# Patient Record
Sex: Male | Born: 1960 | Race: Black or African American | Hispanic: No | Marital: Single | Smoking: Never smoker
Health system: Southern US, Community
[De-identification: ages and names within clinical notes are randomized; demographics above are authoritative.]

---

## 2010-11-29 DIAGNOSIS — F419 Anxiety disorder, unspecified: Secondary | ICD-10-CM | POA: Diagnosis present

## 2010-11-29 DIAGNOSIS — E785 Hyperlipidemia, unspecified: Secondary | ICD-10-CM | POA: Diagnosis present

## 2010-11-29 DIAGNOSIS — F329 Major depressive disorder, single episode, unspecified: Secondary | ICD-10-CM | POA: Insufficient documentation

## 2011-11-27 ENCOUNTER — Emergency Department (HOSPITAL_COMMUNITY)
Admission: EM | Admit: 2011-11-27 | Discharge: 2011-11-27 | Disposition: A | Discharge status: Home / Self Care | Payer: PRIVATE HEALTH INSURANCE | Attending: Emergency Medicine | Admitting: Emergency Medicine

## 2011-11-27 ENCOUNTER — Emergency Department (HOSPITAL_COMMUNITY): Payer: PRIVATE HEALTH INSURANCE

## 2011-11-27 ENCOUNTER — Encounter (HOSPITAL_COMMUNITY): Payer: Self-pay | Admitting: *Deleted

## 2011-11-27 DIAGNOSIS — T65891A Toxic effect of other specified substances, accidental (unintentional), initial encounter: Secondary | ICD-10-CM | POA: Insufficient documentation

## 2011-11-27 DIAGNOSIS — F17200 Nicotine dependence, unspecified, uncomplicated: Secondary | ICD-10-CM | POA: Insufficient documentation

## 2011-11-27 DIAGNOSIS — T5994XA Toxic effect of unspecified gases, fumes and vapors, undetermined, initial encounter: Secondary | ICD-10-CM

## 2011-11-27 DIAGNOSIS — J45909 Unspecified asthma, uncomplicated: Secondary | ICD-10-CM | POA: Insufficient documentation

## 2011-11-27 DIAGNOSIS — Y990 Civilian activity done for income or pay: Secondary | ICD-10-CM | POA: Insufficient documentation

## 2011-11-27 DIAGNOSIS — Y9229 Other specified public building as the place of occurrence of the external cause: Secondary | ICD-10-CM | POA: Insufficient documentation

## 2011-11-27 NOTE — ED Notes (Signed)
Pt to radiology.

## 2011-11-27 NOTE — ED Notes (Addendum)
Pt states he is currently "agitated with the current situation which is probably making my blood pressure go up."  Will continue to monitor pt. Denies needs at this time.
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
## 2011-11-30 NOTE — ED Provider Notes (Signed)
Medical screening examination/treatment/procedure(s) were performed by non-physician practitioner and as supervising physician I was immediately available for consultation/collaboration.   Richardean Canal, MD 11/30/11 [phone]

## 2013-01-09 DIAGNOSIS — I10 Essential (primary) hypertension: Secondary | ICD-10-CM | POA: Diagnosis present

## 2013-09-25 ENCOUNTER — Other Ambulatory Visit: Payer: Self-pay | Admitting: *Deleted

## 2014-04-22 ENCOUNTER — Emergency Department (HOSPITAL_COMMUNITY)
Admission: EM | Admit: 2014-04-22 | Discharge: 2014-04-22 | Disposition: A | Discharge status: Home / Self Care | Payer: BC Managed Care – PPO | Attending: Emergency Medicine | Admitting: Emergency Medicine

## 2014-04-22 ENCOUNTER — Encounter (HOSPITAL_COMMUNITY): Payer: Self-pay | Admitting: *Deleted

## 2014-04-22 ENCOUNTER — Emergency Department (HOSPITAL_COMMUNITY): Payer: BC Managed Care – PPO

## 2014-04-22 DIAGNOSIS — Z791 Long term (current) use of non-steroidal anti-inflammatories (NSAID): Secondary | ICD-10-CM | POA: Insufficient documentation

## 2014-04-22 DIAGNOSIS — Z79899 Other long term (current) drug therapy: Secondary | ICD-10-CM | POA: Insufficient documentation

## 2014-04-22 DIAGNOSIS — K5792 Diverticulitis of intestine, part unspecified, without perforation or abscess without bleeding: Secondary | ICD-10-CM

## 2014-04-22 DIAGNOSIS — R000 Tachycardia, unspecified: Secondary | ICD-10-CM | POA: Diagnosis not present

## 2014-04-22 DIAGNOSIS — D72829 Elevated white blood cell count, unspecified: Secondary | ICD-10-CM | POA: Diagnosis not present

## 2014-04-22 DIAGNOSIS — R109 Unspecified abdominal pain: Secondary | ICD-10-CM

## 2014-04-22 DIAGNOSIS — R1030 Lower abdominal pain, unspecified: Secondary | ICD-10-CM

## 2014-04-22 LAB — COMPREHENSIVE METABOLIC PANEL
ALBUMIN: 4.1 g/dL (ref 3.5–5.2)
ALK PHOS: 46 U/L (ref 39–117)
ALT: 15 U/L (ref 0–53)
ANION GAP: 13 (ref 5–15)
AST: 16 U/L (ref 0–37)
BILIRUBIN TOTAL: 1.2 mg/dL (ref 0.3–1.2)
BUN: 8 mg/dL (ref 6–23)
CALCIUM: 9.5 mg/dL (ref 8.4–10.5)
CO2: 22 mmol/L (ref 19–32)
Chloride: 101 mmol/L (ref 96–112)
Creatinine, Ser: 1.02 mg/dL (ref 0.50–1.35)
GFR, EST NON AFRICAN AMERICAN: 81 mL/min — AB (ref >90)
GLUCOSE: 119 mg/dL — AB (ref 70–99)
POTASSIUM: 4.2 mmol/L (ref 3.5–5.1)
SODIUM: 136 mmol/L (ref 135–145)
TOTAL PROTEIN: 7.1 g/dL (ref 6.0–8.3)

## 2014-04-22 LAB — CBC WITH DIFFERENTIAL/PLATELET
BASOS ABS: 0 10*3/uL (ref 0.0–0.1)
Basophils Relative: 0 % (ref 0–1)
EOS ABS: 0 10*3/uL (ref 0.0–0.7)
Eosinophils Relative: 0 % (ref 0–5)
HEMATOCRIT: 42.3 % (ref 39.0–52.0)
Hemoglobin: 14.3 g/dL (ref 13.0–17.0)
LYMPHS ABS: 0.9 10*3/uL (ref 0.7–4.0)
LYMPHS PCT: 3 % — AB (ref 12–46)
MCH: 33.4 pg (ref 26.0–34.0)
MCHC: 33.8 g/dL (ref 30.0–36.0)
MCV: 98.8 fL (ref 78.0–100.0)
Monocytes Absolute: 0.9 10*3/uL (ref 0.1–1.0)
Monocytes Relative: 3 % (ref 3–12)
NEUTROS ABS: 29.6 10*3/uL — AB (ref 1.7–7.7)
NEUTROS PCT: 94 % — AB (ref 43–77)
PLATELETS: 316 10*3/uL (ref 150–400)
RBC: 4.28 MIL/uL (ref 4.22–5.81)
RDW: 14.6 % (ref 11.5–15.5)
WBC: 31.4 10*3/uL — ABNORMAL HIGH (ref 4.0–10.5)

## 2014-04-22 LAB — LIPASE, BLOOD: LIPASE: 18 U/L (ref 11–59)

## 2014-04-22 MED ORDER — IOHEXOL 300 MG/ML  SOLN
25.0000 mL | Freq: Once | INTRAMUSCULAR | Status: AC | PRN
  Administered 2014-04-22: 25 mL via ORAL

## 2014-04-22 MED ORDER — METRONIDAZOLE IN NACL 5-0.79 MG/ML-% IV SOLN
500.0000 mg | Freq: Once | INTRAVENOUS | Status: AC
  Administered 2014-04-22: 500 mg via INTRAVENOUS
  Filled 2014-04-22: qty 100

## 2014-04-22 MED ORDER — IOHEXOL 300 MG/ML  SOLN
80.0000 mL | Freq: Once | INTRAMUSCULAR | Status: AC | PRN
  Administered 2014-04-22: 80 mL via INTRAVENOUS

## 2014-04-22 MED ORDER — SODIUM CHLORIDE 0.9 % IV BOLUS (SEPSIS)
1000.0000 mL | Freq: Once | INTRAVENOUS | Status: AC
Start: 2014-04-22 — End: 2014-04-22
  Administered 2014-04-22: 1000 mL via INTRAVENOUS

## 2014-04-22 MED ORDER — METRONIDAZOLE 500 MG PO TABS: 500.0000 mg | ORAL_TABLET | Freq: Two times a day (BID) | ORAL | Status: DC

## 2014-04-22 MED ORDER — HYDROMORPHONE HCL 1 MG/ML IJ SOLN
1.0000 mg | Freq: Once | INTRAMUSCULAR | Status: AC
  Administered 2014-04-22: 1 mg via INTRAVENOUS
  Filled 2014-04-22: qty 1

## 2014-04-22 MED ORDER — CIPROFLOXACIN HCL 500 MG PO TABS: 500.0000 mg | ORAL_TABLET | Freq: Two times a day (BID) | ORAL | Status: DC

## 2014-04-22 MED ORDER — ONDANSETRON HCL 4 MG/2ML IJ SOLN
4.0000 mg | Freq: Once | INTRAMUSCULAR | Status: AC
  Administered 2014-04-22: 4 mg via INTRAVENOUS
  Filled 2014-04-22: qty 2

## 2014-04-22 MED ORDER — OXYCODONE-ACETAMINOPHEN 5-325 MG PO TABS: 2.0000 | ORAL_TABLET | Freq: Four times a day (QID) | ORAL | Status: DC | PRN

## 2014-04-22 MED ORDER — ONDANSETRON HCL 4 MG PO TABS: 4.0000 mg | ORAL_TABLET | Freq: Four times a day (QID) | ORAL | Status: AC

## 2014-04-22 MED ORDER — CIPROFLOXACIN IN D5W 400 MG/200ML IV SOLN
400.0000 mg | Freq: Once | INTRAVENOUS | Status: AC
  Administered 2014-04-22: 400 mg via INTRAVENOUS
  Filled 2014-04-22: qty 200

## 2014-04-22 NOTE — ED Notes (Signed)
CT aware pt finished contrast 

## 2014-04-22 NOTE — ED Notes (Signed)
Pt states that he has had multiple episodes of lower abdominal cramping after eating greasy foods. Pt also reports an episode for diarrhea.

## 2014-04-22 NOTE — ED Notes (Signed)
Called pharmacy to see if cipro and flagyl are compatible.  Pharmacist confirmed that antibiotics are compatible.

## 2014-04-22 NOTE — ED Provider Notes (Signed)
CSN: [id number]     Arrival date & time 04/22/14  1153 History   First MD Initiated Contact with Patient 04/22/14 1400     Chief Complaint  Patient presents with  . Abdominal Cramping     (Consider location/radiation/quality/duration/timing/severity/associated sxs/prior Treatment) HPI Comments: Patient presents to the emergency department with chief complaint of lower abdominal pain 2 days. Patient states the pain started yesterday after eating some greasy food. He states that the pain significantly worsened overnight until now. He states the pain as an 8 out of 10. He denies any nausea or vomiting, but does report some diarrhea. He states that the pain does not radiate. He denies any melena or hematochezia. He reports a subjective fever and chills. He denies any chest pain, cough, shortness of breath, or other symptoms. He has tried taking a Vicodin with no relief. He denies any prior abdominal surgeries. 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  The history is provided by the patient. No language interpreter was used.    History reviewed. No pertinent past medical history. History reviewed. No pertinent past surgical history. No family history on file. History  Substance Use Topics  . Smoking status: Never Smoker   . Smokeless tobacco: Not on file  . Alcohol Use: No    Review of Systems  Constitutional: Negative for fever and chills.  Respiratory: Negative for shortness of breath.   Cardiovascular: Negative for chest pain.  Gastrointestinal: Positive for abdominal pain and diarrhea. Negative for nausea, vomiting and constipation.  Genitourinary: Negative for dysuria.  All other systems reviewed and are negative.     Allergies  Review of patient's allergies indicates no known allergies.  Home Medications   Prior to Admission medications   Medication Sig Start Date End Date Taking? Authorizing Provider  buPROPion (WELLBUTRIN SR) 200 MG 12  hr tablet Take 200 mg by mouth 2 (two) times daily. 02/17/14  Yes Historical Provider, MD  fluocinonide cream (LIDEX) 0.05 % Apply 1 application topically 2 (two) times daily as needed.   Yes Historical Provider, MD  guaiFENesin (MUCINEX) 600 MG 12 hr tablet Take 1,200 mg by mouth 2 (two) times daily.   Yes Historical Provider, MD  HYDROCODONE-ACETAMINOPHEN PO Take 0.5 tablets by mouth as needed (stomach pain).   Yes Historical Provider, MD  ibuprofen (ADVIL,MOTRIN) 200 MG tablet Take 800 mg by mouth 2 (two) times daily.   Yes Historical Provider, MD  LORazepam (ATIVAN) 1 MG tablet Take 1 mg by mouth every 8 (eight) hours as needed. 04/08/14  Yes Historical Provider, MD   BP 152/95 mmHg  Pulse 130  Temp(Src) 99.2 F (37.3 C)  Resp 16  Ht 5\' 8"  (1.727 m)  Wt 175 lb (79.379 kg)  BMI 26.61 kg/m2  SpO2 98% Physical Exam  Constitutional: He is oriented to person, place, and time. He appears well-developed and well-nourished.  HENT:  Head: Normocephalic and atraumatic.  Eyes: Conjunctivae and EOM are normal. Pupils are equal, round, and reactive to light. Right eye exhibits no discharge. Left eye exhibits no discharge. No scleral icterus.  Neck: Normal range of motion. Neck supple. No JVD present.  Cardiovascular: Regular rhythm and normal heart sounds.  Exam reveals no gallop and no friction rub.   No murmur heard. Tachycardia  Pulmonary/Chest: Effort normal and breath sounds normal. No respiratory distress. He has no wheezes. He has no rales. He exhibits no tenderness.  Abdominal: Soft. He exhibits no distension and no mass. There  is tenderness. There is no rebound and no guarding.  Bilateral lower abdominal tenderness, mild abdominal distention  Musculoskeletal: Normal range of motion. He exhibits no edema or tenderness.  Neurological: He is alert and oriented to person, place, and time.  Skin: Skin is warm and dry.  Psychiatric: He has a normal mood and affect. His behavior is normal.  Judgment and thought content normal.  Nursing note and vitals reviewed.   ED Course  Procedures (including critical care time) Results for orders placed or performed during the hospital encounter of 04/22/14  CBC with Differential  Result Value Ref Range   WBC 31.4 (H) 4.0 - 10.5 K/uL   RBC 4.28 4.22 - 5.81 MIL/uL   Hemoglobin 14.3 13.0 - 17.0 g/dL   HCT 16.1 09.6 - 04.5 %   MCV 98.8 78.0 - 100.0 fL   MCH 33.4 26.0 - 34.0 pg   MCHC 33.8 30.0 - 36.0 g/dL   RDW 40.9 81.1 - 91.4 %   Platelets 316 150 - 400 K/uL   Neutrophils Relative % 94 (H) 43 - 77 %   Lymphocytes Relative 3 (L) 12 - 46 %   Monocytes Relative 3 3 - 12 %   Eosinophils Relative 0 0 - 5 %   Basophils Relative 0 0 - 1 %   Neutro Abs 29.6 (H) 1.7 - 7.7 K/uL   Lymphs Abs 0.9 0.7 - 4.0 K/uL   Monocytes Absolute 0.9 0.1 - 1.0 K/uL   Eosinophils Absolute 0.0 0.0 - 0.7 K/uL   Basophils Absolute 0.0 0.0 - 0.1 K/uL   RBC Morphology STOMATOCYTES   Comprehensive metabolic panel  Result Value Ref Range   Sodium 136 135 - 145 mmol/L   Potassium 4.2 3.5 - 5.1 mmol/L   Chloride 101 96 - 112 mmol/L   CO2 22 19 - 32 mmol/L   Glucose, Bld 119 (H) 70 - 99 mg/dL   BUN 8 6 - 23 mg/dL   Creatinine, Ser 7.82 0.50 - 1.35 mg/dL   Calcium 9.5 8.4 - 95.6 mg/dL   Total Protein 7.1 6.0 - 8.3 g/dL   Albumin 4.1 3.5 - 5.2 g/dL   AST 16 0 - 37 U/L   ALT 15 0 - 53 U/L   Alkaline Phosphatase 46 39 - 117 U/L   Total Bilirubin 1.2 0.3 - 1.2 mg/dL   GFR calc non Af Amer 81 (L) >90 mL/min   GFR calc Af Amer >90 >90 mL/min   Anion gap 13 5 - 15  Lipase, blood  Result Value Ref Range   Lipase 18 11 - 59 U/L   Ct Abdomen Pelvis W Contrast  04/22/2014   CLINICAL DATA:  Severe lower abdominal pain with cramping. One episode of diarrhea.  EXAM: CT ABDOMEN AND PELVIS WITH CONTRAST  TECHNIQUE: Multidetector CT imaging of the abdomen and pelvis was performed using the standard protocol following bolus administration of intravenous contrast.   CONTRAST:  80mL OMNIPAQUE IOHEXOL 300 MG/ML  SOLN  COMPARISON:  None.  FINDINGS: Lower chest: Clear lung bases. Normal heart size without pericardial or pleural effusion.  Hepatobiliary: Normal liver. Normal gallbladder, without biliary ductal dilatation.  Pancreas: Normal, without mass or ductal dilatation.  Spleen: Normal  Adrenals/Urinary Tract: Normal adrenal glands. Normal kidneys, without hydronephrosis. Normal urinary bladder.  Stomach/Bowel: Normal stomach, without wall thickening. Moderate wall thickening of and edema surrounding the sigmoid colon. Extensive colonic diverticulosis surrounds this area. Example image 65. Edema is identified adjacent the urinary bladder on  image 73. No bladder wall thickening or gas within. No drainable pericolonic fluid collection or free perforation.  Normal terminal ileum and appendix.  Normal small bowel.  Vascular/Lymphatic: Aortic and branch vessel atherosclerosis. No abdominopelvic adenopathy.  Reproductive: Normal prostate.  Other: No significant free fluid. A right sided anterior pelvic skin lesion measures 1.4 cm on image 78, just above the base of the penis.  Musculoskeletal: No acute osseous abnormality.  IMPRESSION: 1. Moderate uncomplicated sigmoid diverticulitis. 2. Skin lesion about the anterior right pelvis. Likely a sebaceous cyst. Recommend physical exam correlation.   Electronically Signed   By: Jeronimo Greaves M.D.   On: 04/22/2014 16:12      EKG Interpretation None      MDM   Final diagnoses:  Abdominal pain  Diverticulitis of intestine without perforation or abscess without bleeding    Patient with severe abdominal pain. Pain started yesterday. We'll treat pain, check CT scan. Severe  leukocytosis to 31.4.  CT scan remarkable for sigmoid diverticulitis. This is consistent with the patient's symptoms of left lower quadrant pain and diarrhea. Patient does have a significant leukocytosis, but states that he always has a significant  leukocytosis.  Patient seen by and discussed with Dr. Estell Harpin, who is agreeable with discharge to home with close follow-up.  Will dc with zofron, percocet, flagyl, and cipro.   Patient understands and agrees with the plan. He is stable and ready for discharge.  Filed Vitals:   04/22/14 1515  BP: 155/86  Pulse: 97  Temp:   Resp:       Roxy Horseman, PA-C 04/22/14 1715  Bethann Berkshire, MD 04/23/14 1538

## 2014-04-22 NOTE — Discharge Instructions (Signed)

## 2014-04-22 NOTE — ED Notes (Signed)
Pt made aware to return if symptoms worsen or if any life threatening symptoms occur.   

## 2014-04-22 NOTE — ED Notes (Signed)
Pt taken to CT.

## 2014-06-18 ENCOUNTER — Ambulatory Visit (HOSPITAL_COMMUNITY): Payer: Self-pay | Admitting: Clinical

## 2015-04-12 ENCOUNTER — Ambulatory Visit: Payer: BC Managed Care – PPO | Admitting: Podiatry

## 2015-10-11 DIAGNOSIS — Z636 Dependent relative needing care at home: Secondary | ICD-10-CM | POA: Insufficient documentation

## 2017-06-10 ENCOUNTER — Encounter (HOSPITAL_COMMUNITY): Payer: Self-pay

## 2017-06-10 ENCOUNTER — Other Ambulatory Visit: Payer: Self-pay

## 2017-06-10 ENCOUNTER — Emergency Department (HOSPITAL_COMMUNITY)
Admission: EM | Admit: 2017-06-10 | Discharge: 2017-06-10 | Disposition: A | Discharge status: Home / Self Care | Payer: BC Managed Care – PPO | Attending: Emergency Medicine | Admitting: Emergency Medicine

## 2017-06-10 ENCOUNTER — Emergency Department (HOSPITAL_COMMUNITY): Payer: BC Managed Care – PPO

## 2017-06-10 DIAGNOSIS — K5792 Diverticulitis of intestine, part unspecified, without perforation or abscess without bleeding: Secondary | ICD-10-CM | POA: Diagnosis not present

## 2017-06-10 DIAGNOSIS — R1030 Lower abdominal pain, unspecified: Secondary | ICD-10-CM | POA: Diagnosis present

## 2017-06-10 DIAGNOSIS — Z79899 Other long term (current) drug therapy: Secondary | ICD-10-CM | POA: Diagnosis not present

## 2017-06-10 LAB — CBC WITH DIFFERENTIAL/PLATELET
BASOS ABS: 0 10*3/uL (ref 0.0–0.1)
Basophils Relative: 0 %
EOS PCT: 0 %
Eosinophils Absolute: 0 10*3/uL (ref 0.0–0.7)
HEMATOCRIT: 42.6 % (ref 39.0–52.0)
Hemoglobin: 14.6 g/dL (ref 13.0–17.0)
LYMPHS PCT: 8 %
Lymphs Abs: 1.8 10*3/uL (ref 0.7–4.0)
MCH: 33.5 pg (ref 26.0–34.0)
MCHC: 34.3 g/dL (ref 30.0–36.0)
MCV: 97.7 fL (ref 78.0–100.0)
MONO ABS: 1.6 10*3/uL — AB (ref 0.1–1.0)
MONOS PCT: 7 %
Neutro Abs: 19.2 10*3/uL — ABNORMAL HIGH (ref 1.7–7.7)
Neutrophils Relative %: 85 %
PLATELETS: 328 10*3/uL (ref 150–400)
RBC: 4.36 MIL/uL (ref 4.22–5.81)
RDW: 13.6 % (ref 11.5–15.5)
WBC: 22.6 10*3/uL — ABNORMAL HIGH (ref 4.0–10.5)

## 2017-06-10 LAB — COMPREHENSIVE METABOLIC PANEL
ALT: 20 U/L (ref 17–63)
ANION GAP: 14 (ref 5–15)
AST: 17 U/L (ref 15–41)
Albumin: 4.4 g/dL (ref 3.5–5.0)
Alkaline Phosphatase: 49 U/L (ref 38–126)
BUN: 10 mg/dL (ref 6–20)
CO2: 21 mmol/L — ABNORMAL LOW (ref 22–32)
Calcium: 9.4 mg/dL (ref 8.9–10.3)
Chloride: 102 mmol/L (ref 101–111)
Creatinine, Ser: 0.95 mg/dL (ref 0.61–1.24)
Glucose, Bld: 110 mg/dL — ABNORMAL HIGH (ref 65–99)
POTASSIUM: 3.8 mmol/L (ref 3.5–5.1)
Sodium: 137 mmol/L (ref 135–145)
Total Bilirubin: 1.3 mg/dL — ABNORMAL HIGH (ref 0.3–1.2)
Total Protein: 8.3 g/dL — ABNORMAL HIGH (ref 6.5–8.1)

## 2017-06-10 LAB — URINALYSIS, ROUTINE W REFLEX MICROSCOPIC
BACTERIA UA: NONE SEEN
Bilirubin Urine: NEGATIVE
GLUCOSE, UA: NEGATIVE mg/dL
Ketones, ur: NEGATIVE mg/dL
LEUKOCYTES UA: NEGATIVE
Nitrite: NEGATIVE
PH: 5 (ref 5.0–8.0)
Protein, ur: NEGATIVE mg/dL
SPECIFIC GRAVITY, URINE: 1.032 — AB (ref 1.005–1.030)

## 2017-06-10 LAB — LIPASE, BLOOD: LIPASE: 23 U/L (ref 11–51)

## 2017-06-10 MED ORDER — CIPROFLOXACIN IN D5W 400 MG/200ML IV SOLN
400.0000 mg | Freq: Once | INTRAVENOUS | Status: AC
  Administered 2017-06-10: 400 mg via INTRAVENOUS
  Filled 2017-06-10: qty 200

## 2017-06-10 MED ORDER — METRONIDAZOLE 500 MG PO TABS: 500.0000 mg | ORAL_TABLET | Freq: Two times a day (BID) | ORAL | 0 refills | Status: DC

## 2017-06-10 MED ORDER — ONDANSETRON HCL 4 MG/2ML IJ SOLN
4.0000 mg | Freq: Once | INTRAMUSCULAR | Status: AC
  Administered 2017-06-10: 4 mg via INTRAVENOUS
  Filled 2017-06-10: qty 2

## 2017-06-10 MED ORDER — HYDROCODONE-ACETAMINOPHEN 5-325 MG PO TABS: 1.0000 | ORAL_TABLET | Freq: Four times a day (QID) | ORAL | 0 refills | Status: AC | PRN

## 2017-06-10 MED ORDER — IOPAMIDOL (ISOVUE-300) INJECTION 61%
INTRAVENOUS | Status: AC
  Filled 2017-06-10: qty 100

## 2017-06-10 MED ORDER — SENNOSIDES-DOCUSATE SODIUM 8.6-50 MG PO TABS: 2.0000 | ORAL_TABLET | Freq: Every day | ORAL | 0 refills | Status: AC

## 2017-06-10 MED ORDER — ACETAMINOPHEN 500 MG PO TABS
1000.0000 mg | ORAL_TABLET | Freq: Once | ORAL | Status: AC
  Administered 2017-06-10: 1000 mg via ORAL
  Filled 2017-06-10: qty 2

## 2017-06-10 MED ORDER — HYDROMORPHONE HCL 1 MG/ML IJ SOLN
0.5000 mg | Freq: Once | INTRAMUSCULAR | Status: AC
  Administered 2017-06-10: 0.5 mg via INTRAVENOUS
  Filled 2017-06-10: qty 1

## 2017-06-10 MED ORDER — CIPROFLOXACIN HCL 500 MG PO TABS: 500.0000 mg | ORAL_TABLET | Freq: Two times a day (BID) | ORAL | 0 refills | Status: DC

## 2017-06-10 MED ORDER — IOPAMIDOL (ISOVUE-300) INJECTION 61%
100.0000 mL | Freq: Once | INTRAVENOUS | Status: AC | PRN
  Administered 2017-06-10: 100 mL via INTRAVENOUS

## 2017-06-10 MED ORDER — SODIUM CHLORIDE 0.9 % IV SOLN
INTRAVENOUS | Status: DC
  Administered 2017-06-10: 09:00:00 via INTRAVENOUS

## 2017-06-10 MED ORDER — METRONIDAZOLE IN NACL 5-0.79 MG/ML-% IV SOLN
500.0000 mg | Freq: Once | INTRAVENOUS | Status: AC
  Administered 2017-06-10: 500 mg via INTRAVENOUS
  Filled 2017-06-10: qty 100

## 2017-06-10 NOTE — ED Provider Notes (Signed)
[facility]-EMERGENCY DEPT Provider Note   CSN: [id number] Arrival date & time: 06/10/17  0737     History   Chief Complaint Chief Complaint  Patient presents with  . Abdominal Pain    HPI OSMANI KERSTEN is a 57 y.o. male.  HPI  Patient presents with abdominal pain, nausea, change in bowel movements.  Symptoms began 3 days ago, after the patient recalls eating spaghetti with meatballs. He notes that since that time he has had pain focally in the left lower quadrant, though diffusely as well. Pain is sore, severe, with associated nausea, and as above, change in bowel movements, going from regular stool to inconsistent small production. Generalized weakness, fatigue is present as well, though no clear fever, no confusion, dissertation, chest pain, dyspnea. Patient is here with his wife who assists with the HPI.  History reviewed. No pertinent past medical history.  There are no active problems to display for this patient.   No past surgical history on file.      Home Medications    Prior to Admission medications   Medication Sig Start Date End Date Taking? Authorizing Provider  acetaminophen (TYLENOL) 500 MG tablet Take 1,000 mg by mouth every 6 (six) hours as needed for moderate pain.   Yes [provider]  bismuth subsalicylate (PEPTO BISMOL) 262 MG/15ML suspension Take 30 mLs by mouth every 6 (six) hours as needed for indigestion.   Yes [provider]  buPROPion (WELLBUTRIN SR) 200 MG 12 hr tablet Take 200 mg by mouth 2 (two) times daily. 02/17/14  Yes [provider]  fluocinonide cream (LIDEX) 0.05 % Apply 1 application topically 2 (two) times daily as needed (rash).    Yes [provider]  ibuprofen (ADVIL,MOTRIN) 200 MG tablet Take 800 mg by mouth every 8 (eight) hours as needed for moderate pain.    Yes [provider]  LORazepam (ATIVAN) 1 MG tablet Take 1 mg by mouth every 8 (eight) hours as  needed for anxiety or sleep.  04/08/14  Yes [provider]  simethicone (MYLICON) 125 MG chewable tablet Chew 250 mg by mouth every 6 (six) hours as needed for flatulence.    Yes [provider]  ciprofloxacin (CIPRO) 500 MG tablet Take 1 tablet (500 mg total) by mouth 2 (two) times daily. Patient not taking: Reported on 06/10/2017 04/22/14   Roxy Horseman, PA-C  guaiFENesin (MUCINEX) 600 MG 12 hr tablet Take 1,200 mg by mouth 2 (two) times daily.    [provider]  HYDROCODONE-ACETAMINOPHEN PO Take 0.5 tablets by mouth as needed (stomach pain).    [provider]  metroNIDAZOLE (FLAGYL) 500 MG tablet Take 1 tablet (500 mg total) by mouth 2 (two) times daily. Patient not taking: Reported on 06/10/2017 04/22/14   Roxy Horseman, PA-C  ondansetron (ZOFRAN) 4 MG tablet Take 1 tablet (4 mg total) by mouth every 6 (six) hours. Patient not taking: Reported on 06/10/2017 04/22/14   Roxy Horseman, PA-C  oxyCODONE-acetaminophen (PERCOCET/ROXICET) 5-325 MG per tablet Take 2 tablets by mouth every 6 (six) hours as needed for severe pain. Patient not taking: Reported on 06/10/2017 04/22/14   Roxy Horseman, PA-C    Family History No family history on file.  Social History Social History   Tobacco Use  . Smoking status: Never Smoker  Substance Use Topics  . Alcohol use: No  . Drug use: No     Allergies   Patient has no known allergies.   Review  of Systems Review of Systems  Constitutional:       Per HPI, otherwise negative  HENT:       Per HPI, otherwise negative  Respiratory:       Per HPI, otherwise negative  Cardiovascular:       Per HPI, otherwise negative  Gastrointestinal: Positive for abdominal pain and nausea. Negative for vomiting.  Endocrine:       Negative aside from HPI  Genitourinary:       Neg aside from HPI   Musculoskeletal:       Per HPI, otherwise negative  Skin: Negative.   Neurological: Positive for weakness. Negative for  syncope.     Physical Exam Updated Vital Signs BP (!) 159/101   Pulse 90   Temp 98.6 F (37 C) (Oral)   Resp 18   SpO2 98%   Physical Exam  Constitutional: He is oriented to person, place, and time. He appears well-developed. No distress.  HENT:  Head: Normocephalic and atraumatic.  Eyes: Conjunctivae and EOM are normal.  Cardiovascular: Normal rate and regular rhythm.  Pulmonary/Chest: Effort normal. No stridor. No respiratory distress.  Abdominal: He exhibits no distension. There is tenderness in the left lower quadrant. There is guarding. There is no rigidity.  Musculoskeletal: He exhibits no edema.  Neurological: He is alert and oriented to person, place, and time.  Skin: Skin is warm and dry.  Psychiatric: He has a normal mood and affect.  Nursing note and vitals reviewed.    ED Treatments / Results  Labs (all labs ordered are listed, but only abnormal results are displayed) Labs Reviewed  COMPREHENSIVE METABOLIC PANEL - Abnormal; Notable for the following components:      Result Value   CO2 21 (*)    Glucose, Bld 110 (*)    Total Protein 8.3 (*)    Total Bilirubin 1.3 (*)    All other components within normal limits  CBC WITH DIFFERENTIAL/PLATELET - Abnormal; Notable for the following components:   WBC 22.6 (*)    Neutro Abs 19.2 (*)    Monocytes Absolute 1.6 (*)    All other components within normal limits  URINALYSIS, ROUTINE W REFLEX MICROSCOPIC - Abnormal; Notable for the following components:   Specific Gravity, Urine 1.032 (*)    Hgb urine dipstick SMALL (*)    All other components within normal limits  LIPASE, BLOOD    EKG None  Radiology Ct Abdomen Pelvis W Contrast  Result Date: 06/10/2017 CLINICAL DATA:  Lower abdominal pain after eating spicy food. Possible diverticulitis. EXAM: CT ABDOMEN AND PELVIS WITH CONTRAST TECHNIQUE: Multidetector CT imaging of the abdomen and pelvis was performed using the standard protocol following bolus  administration of intravenous contrast. CONTRAST:  ISOVUE-300 IOPAMIDOL (ISOVUE-300) INJECTION 61% COMPARISON:  04/22/2014 FINDINGS: Lower chest: Linear atelectasis right base. Hepatobiliary: Normal. Pancreas: Normal. Spleen: Normal. Adrenals/Urinary Tract: Adrenal glands are normal. Kidneys normal size without hydronephrosis or nephrolithiasis. Ureters and bladder are normal. Stomach/Bowel: Stomach and small bowel are within normal. Appendix is normal. There is diverticulosis of the colon with adjacent inflammatory change over the distal descending to sigmoid colon in the left lower quadrant. This is compatible with acute diverticulitis. There is no evidence of perforation or diverticular abscess. Small amount of free fluid in the left pericolic gutter. Vascular/Lymphatic: Mild calcified plaque over the abdominal aorta and iliac arteries. No adenopathy. Reproductive: Normal. Other: None. Musculoskeletal: No acute findings. IMPRESSION: Colonic diverticulosis with evidence of acute diverticulitis involving the distal descending  colon and proximal sigmoid colon in the left lower quadrant. No perforation or diverticular abscess. Right basilar atelectasis. Aortic Atherosclerosis (ICD10-I70.0). Electronically Signed   By: Elberta Fortis M.D.   On: 06/10/2017 09:57    Procedures Procedures (including critical care time)  Medications Ordered in ED Medications  0.9 %  sodium chloride infusion ( Intravenous New Bag/Given 06/10/17 0831)  iopamidol (ISOVUE-300) 61 % injection (has no administration in time range)  ondansetron (ZOFRAN) injection 4 mg (4 mg Intravenous Given 06/10/17 0832)  HYDROmorphone (DILAUDID) injection 0.5 mg (0.5 mg Intravenous Given 06/10/17 0831)  acetaminophen (TYLENOL) tablet 1,000 mg (1,000 mg Oral Given 06/10/17 0831)  iopamidol (ISOVUE-300) 61 % injection 100 mL (100 mLs Intravenous Contrast Given 06/10/17 0917)  ciprofloxacin (CIPRO) IVPB 400 mg (0 mg Intravenous Stopped 06/10/17  1237)  metroNIDAZOLE (FLAGYL) IVPB 500 mg (0 mg Intravenous Stopped 06/10/17 1132)     Initial Impression / Assessment and Plan / ED Course  I have reviewed the triage vital signs and the nursing notes.  Pertinent labs & imaging results that were available during my care of the patient were reviewed by me and considered in my medical decision making (see chart for details).  Chart review notable for history of diverticulitis in 2016. Patient states that he had a colonoscopy following that episode, had removal of several polyps, but otherwise no notable findings.    12:46 PM Patient awake and alert, has completed both antibiotics, has had no decline in his condition, has had good pain control here with morphine and fentanyl. We had a lengthy conversation about today's evaluation, including evidence for diverticulitis. No evidence for perforation, abscess. Patient has a gastroenterologist with whom we will follow-up, and having completed IV antibiotics here, x2, and with good pain reduction, and with no evidence for bacteremia, sepsis, patient was discharged in stable condition.  Final Clinical Impressions(s) / ED Diagnoses   Final diagnoses:  Acute diverticulitis     Gerhard Munch, MD 06/10/17 1247

## 2017-06-10 NOTE — ED Notes (Signed)
U/A per MD order. Per pt unable to urinate at this time.

## 2017-06-10 NOTE — ED Triage Notes (Signed)
He c/o lower abd. Pain. He cites eating "spicy food" a couple of hours before this started. He is fairly confident he recognizes this pain as being diverticulitis, which he had had ~ 2 years ago. He denies diarrhea and denies seeing any blood in his stools.

## 2017-10-16 ENCOUNTER — Encounter (HOSPITAL_COMMUNITY): Payer: Self-pay | Admitting: Radiology

## 2017-10-16 ENCOUNTER — Other Ambulatory Visit: Payer: Self-pay

## 2017-10-16 ENCOUNTER — Emergency Department (HOSPITAL_COMMUNITY): Payer: BC Managed Care – PPO

## 2017-10-16 ENCOUNTER — Inpatient Hospital Stay (HOSPITAL_COMMUNITY)
Admission: EM | Admit: 2017-10-16 | Discharge: 2017-10-24 | DRG: 392 | Disposition: A | Discharge status: Home / Self Care | Payer: BC Managed Care – PPO | Attending: Surgery | Admitting: Surgery

## 2017-10-16 DIAGNOSIS — R739 Hyperglycemia, unspecified: Secondary | ICD-10-CM | POA: Diagnosis present

## 2017-10-16 DIAGNOSIS — F419 Anxiety disorder, unspecified: Secondary | ICD-10-CM | POA: Diagnosis present

## 2017-10-16 DIAGNOSIS — K5720 Diverticulitis of large intestine with perforation and abscess without bleeding: Secondary | ICD-10-CM | POA: Diagnosis not present

## 2017-10-16 DIAGNOSIS — Z792 Long term (current) use of antibiotics: Secondary | ICD-10-CM

## 2017-10-16 DIAGNOSIS — I10 Essential (primary) hypertension: Secondary | ICD-10-CM | POA: Diagnosis present

## 2017-10-16 DIAGNOSIS — F439 Reaction to severe stress, unspecified: Secondary | ICD-10-CM | POA: Diagnosis present

## 2017-10-16 DIAGNOSIS — R188 Other ascites: Secondary | ICD-10-CM | POA: Diagnosis present

## 2017-10-16 DIAGNOSIS — E785 Hyperlipidemia, unspecified: Secondary | ICD-10-CM | POA: Diagnosis present

## 2017-10-16 DIAGNOSIS — Z86010 Personal history of colonic polyps: Secondary | ICD-10-CM | POA: Diagnosis not present

## 2017-10-16 DIAGNOSIS — Z79899 Other long term (current) drug therapy: Secondary | ICD-10-CM | POA: Diagnosis not present

## 2017-10-16 DIAGNOSIS — I700 Atherosclerosis of aorta: Secondary | ICD-10-CM | POA: Diagnosis present

## 2017-10-16 DIAGNOSIS — J439 Emphysema, unspecified: Secondary | ICD-10-CM | POA: Diagnosis present

## 2017-10-16 DIAGNOSIS — F17210 Nicotine dependence, cigarettes, uncomplicated: Secondary | ICD-10-CM | POA: Diagnosis present

## 2017-10-16 LAB — COMPREHENSIVE METABOLIC PANEL
ALT: 23 U/L (ref 0–44)
AST: 19 U/L (ref 15–41)
Albumin: 4.3 g/dL (ref 3.5–5.0)
Alkaline Phosphatase: 42 U/L (ref 38–126)
Anion gap: 12 (ref 5–15)
BUN: 14 mg/dL (ref 6–20)
CO2: 22 mmol/L (ref 22–32)
Calcium: 9.1 mg/dL (ref 8.9–10.3)
Chloride: 106 mmol/L (ref 98–111)
Creatinine, Ser: 1.03 mg/dL (ref 0.61–1.24)
Glucose, Bld: 115 mg/dL — ABNORMAL HIGH (ref 70–99)
Potassium: 3.9 mmol/L (ref 3.5–5.1)
SODIUM: 140 mmol/L (ref 135–145)
Total Bilirubin: 0.6 mg/dL (ref 0.3–1.2)
Total Protein: 7.6 g/dL (ref 6.5–8.1)

## 2017-10-16 LAB — CBC WITH DIFFERENTIAL/PLATELET
BASOS ABS: 0 10*3/uL (ref 0.0–0.1)
Basophils Relative: 0 %
Eosinophils Absolute: 0.1 10*3/uL (ref 0.0–0.7)
Eosinophils Relative: 1 %
HCT: 43.2 % (ref 39.0–52.0)
Hemoglobin: 14.6 g/dL (ref 13.0–17.0)
LYMPHS PCT: 16 %
Lymphs Abs: 2.9 10*3/uL (ref 0.7–4.0)
MCH: 33.4 pg (ref 26.0–34.0)
MCHC: 33.8 g/dL (ref 30.0–36.0)
MCV: 98.9 fL (ref 78.0–100.0)
Monocytes Absolute: 0.9 10*3/uL (ref 0.1–1.0)
Monocytes Relative: 5 %
Neutro Abs: 14.2 10*3/uL — ABNORMAL HIGH (ref 1.7–7.7)
Neutrophils Relative %: 78 %
Platelets: 388 10*3/uL (ref 150–400)
RBC: 4.37 MIL/uL (ref 4.22–5.81)
RDW: 13.8 % (ref 11.5–15.5)
WBC: 18.1 10*3/uL — ABNORMAL HIGH (ref 4.0–10.5)

## 2017-10-16 LAB — URINALYSIS, ROUTINE W REFLEX MICROSCOPIC
Bilirubin Urine: NEGATIVE
GLUCOSE, UA: NEGATIVE mg/dL
HGB URINE DIPSTICK: NEGATIVE
KETONES UR: 5 mg/dL — AB
LEUKOCYTES UA: NEGATIVE
Nitrite: NEGATIVE
PROTEIN: NEGATIVE mg/dL
Specific Gravity, Urine: 1.01 (ref 1.005–1.030)
pH: 5 (ref 5.0–8.0)

## 2017-10-16 LAB — LIPASE, BLOOD: LIPASE: 35 U/L (ref 11–51)

## 2017-10-16 MED ORDER — HYDROCODONE-ACETAMINOPHEN 5-325 MG PO TABS
1.0000 | ORAL_TABLET | ORAL | Status: DC | PRN
  Administered 2017-10-17: 2 via ORAL
  Administered 2017-10-17: 1 via ORAL
  Administered 2017-10-18 (×2): 2 via ORAL
  Filled 2017-10-16 (×2): qty 2
  Filled 2017-10-16: qty 1
  Filled 2017-10-16: qty 2

## 2017-10-16 MED ORDER — ONDANSETRON 4 MG PO TBDP: 4.0000 mg | ORAL_TABLET | Freq: Four times a day (QID) | ORAL | Status: DC | PRN

## 2017-10-16 MED ORDER — BUPROPION HCL ER (SR) 100 MG PO TB12
200.0000 mg | ORAL_TABLET | Freq: Two times a day (BID) | ORAL | Status: DC
Start: 2017-10-17 — End: 2017-10-24
  Administered 2017-10-17 – 2017-10-24 (×15): 200 mg via ORAL
  Filled 2017-10-16 (×5): qty 2
  Filled 2017-10-16: qty 1
  Filled 2017-10-16 (×10): qty 2

## 2017-10-16 MED ORDER — MORPHINE SULFATE (PF) 4 MG/ML IV SOLN
4.0000 mg | Freq: Once | INTRAVENOUS | Status: AC
  Administered 2017-10-16: 4 mg via INTRAVENOUS
  Filled 2017-10-16: qty 1

## 2017-10-16 MED ORDER — TRAMADOL HCL 50 MG PO TABS: 50.0000 mg | ORAL_TABLET | Freq: Four times a day (QID) | ORAL | Status: DC | PRN

## 2017-10-16 MED ORDER — LORAZEPAM 2 MG/ML IJ SOLN
1.0000 mg | Freq: Once | INTRAMUSCULAR | Status: AC
  Administered 2017-10-16: 1 mg via INTRAVENOUS
  Filled 2017-10-16: qty 1

## 2017-10-16 MED ORDER — PIPERACILLIN-TAZOBACTAM 3.375 G IVPB
3.3750 g | Freq: Three times a day (TID) | INTRAVENOUS | Status: DC
  Administered 2017-10-17 – 2017-10-24 (×22): 3.375 g via INTRAVENOUS
  Filled 2017-10-16 (×22): qty 50

## 2017-10-16 MED ORDER — PIPERACILLIN-TAZOBACTAM 3.375 G IVPB 30 MIN
3.3750 g | Freq: Once | INTRAVENOUS | Status: AC
  Administered 2017-10-16: 3.375 g via INTRAVENOUS
  Filled 2017-10-16: qty 50

## 2017-10-16 MED ORDER — HYDROMORPHONE HCL 1 MG/ML IJ SOLN
1.0000 mg | Freq: Once | INTRAMUSCULAR | Status: AC
  Administered 2017-10-16: 1 mg via INTRAVENOUS
  Filled 2017-10-16: qty 1

## 2017-10-16 MED ORDER — KCL IN DEXTROSE-NACL 20-5-0.45 MEQ/L-%-% IV SOLN
INTRAVENOUS | Status: DC
  Administered 2017-10-16 – 2017-10-19 (×9): via INTRAVENOUS
  Filled 2017-10-16 (×9): qty 1000

## 2017-10-16 MED ORDER — LORAZEPAM 1 MG PO TABS
1.0000 mg | ORAL_TABLET | Freq: Three times a day (TID) | ORAL | Status: DC | PRN
  Administered 2017-10-17 – 2017-10-24 (×17): 1 mg via ORAL
  Filled 2017-10-16 (×18): qty 1

## 2017-10-16 MED ORDER — IOPAMIDOL (ISOVUE-300) INJECTION 61%
INTRAVENOUS | Status: AC
  Filled 2017-10-16: qty 100

## 2017-10-16 MED ORDER — ONDANSETRON HCL 4 MG/2ML IJ SOLN: 4.0000 mg | Freq: Four times a day (QID) | INTRAMUSCULAR | Status: DC | PRN

## 2017-10-16 MED ORDER — ONDANSETRON HCL 4 MG/2ML IJ SOLN
4.0000 mg | Freq: Once | INTRAMUSCULAR | Status: AC
  Administered 2017-10-16: 4 mg via INTRAVENOUS
  Filled 2017-10-16: qty 2

## 2017-10-16 MED ORDER — MORPHINE SULFATE (PF) 2 MG/ML IV SOLN
1.0000 mg | INTRAVENOUS | Status: DC | PRN
  Administered 2017-10-16 – 2017-10-17 (×2): 2 mg via INTRAVENOUS
  Administered 2017-10-17: 3 mg via INTRAVENOUS
  Administered 2017-10-17: 2 mg via INTRAVENOUS
  Administered 2017-10-17: 3 mg via INTRAVENOUS
  Administered 2017-10-18: 2 mg via INTRAVENOUS
  Administered 2017-10-20: 3 mg via INTRAVENOUS
  Filled 2017-10-16: qty 1
  Filled 2017-10-16: qty 2
  Filled 2017-10-16 (×2): qty 1
  Filled 2017-10-16 (×2): qty 2
  Filled 2017-10-16: qty 1

## 2017-10-16 MED ORDER — MORPHINE SULFATE (PF) 4 MG/ML IV SOLN
4.0000 mg | Freq: Once | INTRAVENOUS | Status: AC | PRN
  Administered 2017-10-16: 4 mg via INTRAVENOUS
  Filled 2017-10-16: qty 1

## 2017-10-16 MED ORDER — ENOXAPARIN SODIUM 40 MG/0.4ML ~~LOC~~ SOLN
40.0000 mg | SUBCUTANEOUS | Status: DC
  Administered 2017-10-17 – 2017-10-24 (×8): 40 mg via SUBCUTANEOUS
  Filled 2017-10-16 (×8): qty 0.4

## 2017-10-16 MED ORDER — IOPAMIDOL (ISOVUE-300) INJECTION 61%
100.0000 mL | Freq: Once | INTRAVENOUS | Status: AC | PRN
  Administered 2017-10-16: 100 mL via INTRAVENOUS

## 2017-10-16 MED ORDER — SODIUM CHLORIDE 0.9 % IV BOLUS
1000.0000 mL | Freq: Once | INTRAVENOUS | Status: AC
Start: 2017-10-16 — End: 2017-10-16
  Administered 2017-10-16: 1000 mL via INTRAVENOUS

## 2017-10-16 NOTE — ED Provider Notes (Signed)
[facility]-EMERGENCY DEPT Provider Note   CSN: [id number] Arrival date & time: 10/16/17  1803     History   Chief Complaint Chief Complaint  Patient presents with  . Abdominal Pain    HPI Caleb Roman is a 57 y.o. male with a history of diverticulitis who presents emergency department today with acute onset of lower abdominal pain.  Patient reports he woke up this morning with 10/10 left lower quadrant abdominal pain that is since become more generalized.  He reports associated nausea without emesis.  He reports he is tried ibuprofen, Tylenol, alcohol and a hydrocodone for his symptoms without any relief.  He reports this feels similar to when he has had diverticulitis in the past but worse.  He notes that he had a large bowel movement yesterday but has not had one today.  He is still passing gas.  He denies any fever, chills, chest pain, shortness of breath, cough, upper abdominal pain, flank pain, urinary symptoms, diarrhea, emesis.  No previous abdominal surgeries.  He reports he is followed by Dr. Ewing Schlein of GI with his last colonoscopy 3 years ago.  He is not on blood thinners.  HPI  No past medical history on file.  There are no active problems to display for this patient.   No past surgical history on file.      Home Medications    Prior to Admission medications   Medication Sig Start Date End Date Taking? Authorizing Provider  acetaminophen (TYLENOL) 500 MG tablet Take 1,000 mg by mouth every 6 (six) hours as needed for moderate pain.    [provider]  bismuth subsalicylate (PEPTO BISMOL) 262 MG/15ML suspension Take 30 mLs by mouth every 6 (six) hours as needed for indigestion.    [provider]  buPROPion (WELLBUTRIN SR) 200 MG 12 hr tablet Take 200 mg by mouth 2 (two) times daily. 02/17/14   [provider]  ciprofloxacin (CIPRO) 500 MG tablet Take 1 tablet (500 mg total) by mouth 2 (two) times daily. 06/10/17    Gerhard Munch, MD  fluocinonide cream (LIDEX) 0.05 % Apply 1 application topically 2 (two) times daily as needed (rash).     [provider]  HYDROcodone-acetaminophen (NORCO/VICODIN) 5-325 MG tablet Take 1 tablet by mouth every 6 (six) hours as needed for severe pain. 06/10/17   Gerhard Munch, MD  ibuprofen (ADVIL,MOTRIN) 200 MG tablet Take 800 mg by mouth every 8 (eight) hours as needed for moderate pain.     [provider]  LORazepam (ATIVAN) 1 MG tablet Take 1 mg by mouth every 8 (eight) hours as needed for anxiety or sleep.  04/08/14   [provider]  metroNIDAZOLE (FLAGYL) 500 MG tablet Take 1 tablet (500 mg total) by mouth 2 (two) times daily. 06/10/17   Gerhard Munch, MD  ondansetron (ZOFRAN) 4 MG tablet Take 1 tablet (4 mg total) by mouth every 6 (six) hours. Patient not taking: Reported on 06/10/2017 04/22/14   Roxy Horseman, PA-C  simethicone (MYLICON) 125 MG chewable tablet Chew 250 mg by mouth every 6 (six) hours as needed for flatulence.     [provider]    Family History No family history on file.  Social History Social History   Tobacco Use  . Smoking status: Never Smoker  Substance Use Topics  . Alcohol use: No  . Drug use: No     Allergies   Patient has no known allergies.   Review of Systems  Review of Systems  All other systems reviewed and are negative.    Physical Exam Updated Vital Signs BP (!) 149/83   Pulse (!) 106   Temp 99.3 F (37.4 C) (Oral)   Resp 19   Wt 79.4 kg   SpO2 94%   BMI 26.62 kg/m   Physical Exam  Constitutional: He appears well-developed and well-nourished.  Appears uncomfortable  HENT:  Head: Normocephalic and atraumatic.  Right Ear: External ear normal.  Left Ear: External ear normal.  Nose: Nose normal.  Mouth/Throat: Uvula is midline, oropharynx is clear and moist and mucous membranes are normal. No tonsillar exudate.  Eyes: Pupils are equal, round, and reactive to  light. Right eye exhibits no discharge. Left eye exhibits no discharge. No scleral icterus.  Neck: Trachea normal. Neck supple. No spinous process tenderness present. No neck rigidity. Normal range of motion present.  Cardiovascular: Normal rate, regular rhythm and intact distal pulses.  No murmur heard. Pulses:      Radial pulses are 2+ on the right side, and 2+ on the left side.       Dorsalis pedis pulses are 2+ on the right side, and 2+ on the left side.       Posterior tibial pulses are 2+ on the right side, and 2+ on the left side.  Pulmonary/Chest: Effort normal and breath sounds normal. He exhibits no tenderness.  Abdominal: Soft. Bowel sounds are normal. He exhibits no distension. There is generalized tenderness and tenderness in the left lower quadrant. There is guarding (voluntary). There is no rigidity and no rebound.  Musculoskeletal: He exhibits no edema.  Lymphadenopathy:    He has no cervical adenopathy.  Neurological: He is alert.  Skin: Skin is warm and dry. No rash noted. He is not diaphoretic.  Psychiatric: He has a normal mood and affect.  Nursing note and vitals reviewed.    ED Treatments / Results  Labs (all labs ordered are listed, but only abnormal results are displayed) Labs Reviewed  COMPREHENSIVE METABOLIC PANEL - Abnormal; Notable for the following components:      Result Value   Glucose, Bld 115 (*)    All other components within normal limits  CBC WITH DIFFERENTIAL/PLATELET - Abnormal; Notable for the following components:   WBC 18.1 (*)    Neutro Abs 14.2 (*)    All other components within normal limits  URINALYSIS, ROUTINE W REFLEX MICROSCOPIC - Abnormal; Notable for the following components:   Color, Urine STRAW (*)    Ketones, ur 5 (*)    All other components within normal limits  LIPASE, BLOOD    EKG None  Radiology Ct Abdomen Pelvis W Contrast  Result Date: 10/16/2017 CLINICAL DATA:  Abdominal pain. EXAM: CT ABDOMEN AND PELVIS WITH  CONTRAST TECHNIQUE: Multidetector CT imaging of the abdomen and pelvis was performed using the standard protocol following bolus administration of intravenous contrast. CONTRAST:  100mL ISOVUE-300 IOPAMIDOL (ISOVUE-300) INJECTION 61% COMPARISON:  06/10/2017 CT abdomen/pelvis. FINDINGS: Lower chest: Centrilobular emphysema and scattered parenchymal bands at the lung bases. Hepatobiliary: Normal liver size. No liver mass. Normal gallbladder with no radiopaque cholelithiasis. No biliary ductal dilatation. Pancreas: Normal, with no mass or duct dilation. Spleen: Normal size. No mass. Adrenals/Urinary Tract: Normal adrenals. Normal kidneys with no hydronephrosis and no renal mass. Normal bladder. Stomach/Bowel: Normal non-distended stomach. Normal caliber small bowel with no small bowel wall thickening. Normal appendix. Moderate left colonic diverticulosis. There is moderate wall thickening in the proximal sigmoid colon with associated  pericolonic fat stranding and ill-defined fluid. No additional sites of large bowel wall thickening. Vascular/Lymphatic: Atherosclerotic nonaneurysmal abdominal aorta. Patent portal, splenic, hepatic and renal veins. No pathologically enlarged lymph nodes in the abdomen or pelvis. Reproductive: Top-normal size prostate with nonspecific internal prostatic calcification. Other: There is scattered free air under the anterior right hemidiaphragm. No focal fluid collection. Trace ascites in the left pericolic gutter. Musculoskeletal: No aggressive appearing focal osseous lesions. Mild thoracolumbar spondylosis. IMPRESSION: 1. Acute perforated sigmoid diverticulitis with free air under the right hemidiaphragm. No abscess. 2. Trace ascites in the left paracolic gutter. 3. Aortic Atherosclerosis (ICD10-I70.0) and Emphysema (ICD10-J43.9). Critical Value/emergent results were called by telephone at the time of interpretation on 10/16/2017 at 7:55 pm to DR. Linwood Dibbles, Who verbally acknowledged these  results. Electronically Signed   By: Delbert Phenix M.D.   On: 10/16/2017 19:57    Procedures Procedures (including critical care time) CRITICAL CARE Performed by: Jacinto Halim   Total critical care time: 45 minutes - perforated diverticulitis  Critical care time was exclusive of separately billable procedures and treating other patients.  Critical care was necessary to treat or prevent imminent or life-threatening deterioration.  Critical care was time spent personally by me on the following activities: development of treatment plan with patient and/or surrogate as well as nursing, discussions with consultants, evaluation of patient's response to treatment, examination of patient, obtaining history from patient or surrogate, ordering and performing treatments and interventions, ordering and review of laboratory studies, ordering and review of radiographic studies, pulse oximetry and re-evaluation of patient's condition.   Medications Ordered in ED Medications  iopamidol (ISOVUE-300) 61 % injection (has no administration in time range)  piperacillin-tazobactam (ZOSYN) IVPB 3.375 g (has no administration in time range)  LORazepam (ATIVAN) injection 1 mg (has no administration in time range)  HYDROmorphone (DILAUDID) injection 1 mg (has no administration in time range)  morphine 4 MG/ML injection 4 mg (4 mg Intravenous Given 10/16/17 1826)  ondansetron (ZOFRAN) injection 4 mg (4 mg Intravenous Given 10/16/17 1841)  sodium chloride 0.9 % bolus 1,000 mL (1,000 mLs Intravenous New Bag/Given 10/16/17 1841)  morphine 4 MG/ML injection 4 mg (4 mg Intravenous Given 10/16/17 1858)  iopamidol (ISOVUE-300) 61 % injection 100 mL (100 mLs Intravenous Contrast Given 10/16/17 1925)    Initial Impression / Assessment and Plan / ED Course  I have reviewed the triage vital signs and the nursing notes.  Pertinent labs & imaging results that were available during my care of the patient were reviewed by me  and considered in my medical decision making (see chart for details).     57 y.o. male with perforated diverticulitis with free air under the right hemidiaphragm diagnosed by CT. Images reviewed by myself.  There is no evidence of abscess.  Patient does have an associated leukocytosis.  Lab work is otherwise reassuring.  Patient abdominal exam is improved after pain medication.  He is very anxious after hearing of results.  He was given Ativan in the department.  Patient has been n.p.o. since 12 PM.  He is without any formal past medical history.  He notes his only daily medications are Lorazepam and Wellbutrin.  He denies any blood thinner use.  Antibiotics ordered for order set.   8:34 PM Spoke with Dr. Ezzard Standing. He will come see the patient.   Patient admitted to [facility] service.   Final Clinical Impressions(s) / ED Diagnoses   Final diagnoses:  Perforation of sigmoid colon due to  diverticulitis    ED Discharge Orders    None       Princella Pellegrini 10/16/17 2308    Linwood Dibbles, MD 10/19/17 1314

## 2017-10-16 NOTE — H&P (Addendum)
Re:   Caleb Roman DOB:   01-01-1961 MRN:   [id number]  Chief Complaint Abdominal pain  ASSESEMENT AND PLAN: 1.  Perforated diverticulitis  3rd bout of diverticulitis, the first to require hospitalization.  Plan:  NPO, IVF, antibiotics and reassess  2.  Situation stress disorder/very anxious  3.  Atherosclerosis 4.  Smokes one or two cigarettes per day  Chief Complaint  Patient presents with  . Abdominal Pain   PHYSICIAN REQUESTING CONSULTATION: Leary RocaMichael Maczis, PA, [facility]  HISTORY OF PRESENT ILLNESS: Caleb LaymanCrawford D Crull is a 57 y.o. (DOB: 01-01-1961)  male whose primary care physician is Vito BackersEdwards, Joel L, MD [facility] [place].  He is accompanied by his girlfriend/fiancee - [facility].   The patient has had at least 2 prior bouts of diverticular disease.  The first in March 2016 and the last one in May 2019.  Neither required hospitalization.       He has seen Dr. Leary RocaMark Magod, who did a colonoscopy in 2016.  He has not seen Dr. Ewing SchleinMagod since that time.  That colonoscopy record is not part of the Epic chart.  He developed some vague abdominal pain last week, but this pain got better.  He went with his girlfriend to the movies on Sunday, ate some popcorn, and started having some worsening abdominal pain.  The abdominal pain became much worse about 4 AM this morning.  He came to the emergency room this afternoon.  He has no history of stomach, liver, or pancreatic disease.  He has had no prior abdominal surgery.  CT scan of the abdomen:  1. Acute perforated sigmoid diverticulitis with free air under the right hemidiaphragm. No abscess.  2. Trace ascites in the left paracolic gutter.  3. Aortic Atherosclerosis   WBC - 18,100 - 10/16/2017    History reviewed. No pertinent past medical history.   No past surgical history on file.    Current Facility-Administered Medications  Medication Dose Route Frequency Provider Last Rate  Last Dose  . iopamidol (ISOVUE-300) 61 % injection           . piperacillin-tazobactam (ZOSYN) IVPB 3.375 g  3.375 g Intravenous Once Linwood DibblesKnapp, Jon, MD 100 mL/hr at 10/16/17 2043 3.375 g at 10/16/17 2043   Current Outpatient Medications  Medication Sig Dispense Refill  . acetaminophen (TYLENOL) 500 MG tablet Take 1,000 mg by mouth daily as needed for moderate pain.     Marland Kitchen. buPROPion (WELLBUTRIN SR) 200 MG 12 hr tablet Take 200 mg by mouth 2 (two) times daily.    Marland Kitchen. Dextromethorphan-guaiFENesin (MUCINEX DM PO) Take 2 tablets by mouth at bedtime as needed (nasal congestion).    Marland Kitchen. doxycycline (VIBRA-TABS) 100 MG tablet Take 100 mg by mouth 2 (two) times daily.  0  . fluocinonide cream (LIDEX) 0.05 % Apply 1 application topically 2 (two) times daily as needed (rash).     Marland Kitchen. HYDROcodone-acetaminophen (NORCO/VICODIN) 5-325 MG tablet Take 1 tablet by mouth every 6 (six) hours as needed for severe pain. 15 tablet 0  . ibuprofen (ADVIL,MOTRIN) 200 MG tablet Take 800 mg by mouth daily as needed for moderate pain.     Marland Kitchen. LORazepam (ATIVAN) 1 MG tablet Take 1 mg by mouth every 8 (eight) hours as needed for anxiety or sleep.     . ciprofloxacin (CIPRO) 500 MG tablet Take 1 tablet (500 mg total) by mouth 2 (two) times daily. (Patient not taking: Reported on 10/16/2017)  14 tablet 0  . metroNIDAZOLE (FLAGYL) 500 MG tablet Take 1 tablet (500 mg total) by mouth 2 (two) times daily. (Patient not taking: Reported on 10/16/2017) 14 tablet 0  . ondansetron (ZOFRAN) 4 MG tablet Take 1 tablet (4 mg total) by mouth every 6 (six) hours. (Patient not taking: Reported on 10/16/2017) 12 tablet 0     No Known Allergies  REVIEW OF SYSTEMS: Skin:  No history of rash.  No history of abnormal moles. Infection:  No history of hepatitis or HIV.  No history of MRSA. Neurologic:  No history of stroke.  No history of seizure.  No history of headaches. Cardiac:  No history of hypertension. No history of heart disease.  No history of prior  cardiac catheterization.   Pulmonary:  Smokes one or two cigarettes per day.  Endocrine:  No diabetes. No thyroid disease. Gastrointestinal:  See HPI Urologic:  No history of kidney stones.  No history of bladder infections. Musculoskeletal:  No history of joint or back disease. Hematologic:  No bleeding disorder.  No history of anemia.  Not anticoagulated. Psycho-social:  The patient is oriented.   He says that he has situation stress disorder.  He is on wellbutrin and lorazepam.  He is very anxious.  SOCIAL and FAMILY HISTORY: Unmarried. He is accompanied by his girlfriend/fiancee - [facility]. He has no children. He is retired from [employer] x 10 years.  He works Office manager for [employer]  PHYSICAL EXAM: BP (!) 188/104 (BP Location: Left Arm)   Pulse (!) 101   Temp 99.3 F (37.4 C) (Oral)   Resp 18   Wt 79.4 kg   SpO2 100%   BMI 26.62 kg/m   General: WN M who is alert.  He is very anxious. Skin:  Inspection and palpation - no mass or rash. Eyes:  Conjunctiva and lids unremarkable.            Pupils are equal Ears, Nose, Mouth, and Throat:  Ears and nose unremarkable            Lips and teeth are unremarable. Neck: Supple. No mass, trachea midline.  No thyroid mass. Lymph Nodes:  No supraclavicular, cervical, or inguinal nodes. Lungs: Normal respiratory effort.  Clear to auscultation and symmetric breath sounds. Heart:  Palpation of the heart is normal.            Auscultation: RRR. No murmur or rub.  Abdomen: Soft. No mass.  No hernia. No abdominal scars.            He has bowel sounds.  He has some mild to moderate lower abdominal tenderness.  He has no peritoneal sxes. Rectal: Not done. Musculoskeletal:  Good muscle strength and ROM  in upper and lower extremities.  Neurologic:  Grossly intact to motor and sensory function. Psychiatric: Normal judgement and insight. Behavior is normal.            Oriented to time, person, place.   DATA REVIEWED, COUNSELING  AND COORDINATION OF CARE: Epic notes reviewed. Counseling and coordination of care exceeded more than 50% of the time spent with patient. Total time spent with patient and charting: 45 minutes.  Ovidio Kin, MD,  [facility] [address] [place]  [address]   [place], [place]    [address] Phone:  [phone] FAX:  [phone]

## 2017-10-16 NOTE — ED Notes (Signed)
Bed: [id number] Expected date:  Expected time:  Means of arrival:  Comments: [place]

## 2017-10-16 NOTE — ED Triage Notes (Signed)
Pt reports a hx of diverticulitis. Pt reports abd pain that feels the same

## 2017-10-16 NOTE — ED Notes (Signed)
ED TO INPATIENT HANDOFF REPORT  Name/Age/Gender Caleb Roman 57 y.o. male  Code Status   Home/SNF/Other Home  Chief Complaint Diverticultis  Level of Care/Admitting Diagnosis ED Disposition    ED Disposition Condition [facility] Area: [place] [[id number]]  Level of Care: Med-Surg [16]  Diagnosis: Diverticulitis of colon with perforation [016010]  Admitting Physician: Borup, Cornell  Attending Physician: CCS, MD [[id number]]  Estimated length of stay: past midnight tomorrow  Certification:: I certify this patient will need inpatient services for at least 2 midnights  Bed request comments: 5 west  PT Class (Do Not Modify): Inpatient [101]  PT Acc Code (Do Not Modify): Private [1]       Medical History History reviewed. No pertinent past medical history.  Allergies No Known Allergies  IV Location/Drains/Wounds Patient Lines/Drains/Airways Status   Active Line/Drains/Airways    Name:   Placement date:   Placement time:   Site:   Days:   Peripheral IV 10/16/17 Right Antecubital   10/16/17    1823    Antecubital   less than 1          Labs/Imaging Results for orders placed or performed during the hospital encounter of 10/16/17 (from the past 48 hour(s))  Comprehensive metabolic panel     Status: Abnormal   Collection Time: 10/16/17  6:26 PM  Result Value Ref Range   Sodium 140 135 - 145 mmol/L   Potassium 3.9 3.5 - 5.1 mmol/L   Chloride 106 98 - 111 mmol/L   CO2 22 22 - 32 mmol/L   Glucose, Bld 115 (H) 70 - 99 mg/dL   BUN 14 6 - 20 mg/dL   Creatinine, Ser 1.03 0.61 - 1.24 mg/dL   Calcium 9.1 8.9 - 10.3 mg/dL   Total Protein 7.6 6.5 - 8.1 g/dL   Albumin 4.3 3.5 - 5.0 g/dL   AST 19 15 - 41 U/L   ALT 23 0 - 44 U/L   Alkaline Phosphatase 42 38 - 126 U/L   Total Bilirubin 0.6 0.3 - 1.2 mg/dL   GFR calc non Af Amer >60 >60 mL/min   GFR calc Af Amer >60 >60 mL/min    Comment: (NOTE) The eGFR has been calculated using the CKD  EPI equation. This calculation has not been validated in all clinical situations. eGFR's persistently <60 mL/min signify possible Chronic Kidney Disease.    Anion gap 12 5 - 15    Comment: Performed at [facility], [place] [address], [place], [place] [address]  Lipase, blood     Status: None   Collection Time: 10/16/17  6:26 PM  Result Value Ref Range   Lipase 35 11 - 51 U/L    Comment: Performed at [facility], [place] [address], [place], [place] [address]  CBC with Differential     Status: Abnormal   Collection Time: 10/16/17  6:26 PM  Result Value Ref Range   WBC 18.1 (H) 4.0 - 10.5 K/uL   RBC 4.37 4.22 - 5.81 MIL/uL   Hemoglobin 14.6 13.0 - 17.0 g/dL   HCT 43.2 39.0 - 52.0 %   MCV 98.9 78.0 - 100.0 fL   MCH 33.4 26.0 - 34.0 pg   MCHC 33.8 30.0 - 36.0 g/dL   RDW 13.8 11.5 - 15.5 %   Platelets 388 150 - 400 K/uL   Neutrophils Relative % 78 %   Neutro Abs 14.2 (H) 1.7 - 7.7 K/uL  Lymphocytes Relative 16 %   Lymphs Abs 2.9 0.7 - 4.0 K/uL   Monocytes Relative 5 %   Monocytes Absolute 0.9 0.1 - 1.0 K/uL   Eosinophils Relative 1 %   Eosinophils Absolute 0.1 0.0 - 0.7 K/uL   Basophils Relative 0 %   Basophils Absolute 0.0 0.0 - 0.1 K/uL    Comment: Performed at [facility], [place] [address], [place], [place] [address]  Urinalysis, Routine w reflex microscopic     Status: Abnormal   Collection Time: 10/16/17  6:26 PM  Result Value Ref Range   Color, Urine STRAW (A) YELLOW   APPearance CLEAR CLEAR   Specific Gravity, Urine 1.010 1.005 - 1.030   pH 5.0 5.0 - 8.0   Glucose, UA NEGATIVE NEGATIVE mg/dL   Hgb urine dipstick NEGATIVE NEGATIVE   Bilirubin Urine NEGATIVE NEGATIVE   Ketones, ur 5 (A) NEGATIVE mg/dL   Protein, ur NEGATIVE NEGATIVE mg/dL   Nitrite NEGATIVE NEGATIVE   Leukocytes, UA NEGATIVE NEGATIVE    Comment: Performed at [facility] [address], [place], [place] [address]   Ct Abdomen  Pelvis W Contrast  Result Date: 10/16/2017 CLINICAL DATA:  Abdominal pain. EXAM: CT ABDOMEN AND PELVIS WITH CONTRAST TECHNIQUE: Multidetector CT imaging of the abdomen and pelvis was performed using the standard protocol following bolus administration of intravenous contrast. CONTRAST:  127m ISOVUE-300 IOPAMIDOL (ISOVUE-300) INJECTION 61% COMPARISON:  06/10/2017 CT abdomen/pelvis. FINDINGS: Lower chest: Centrilobular emphysema and scattered parenchymal bands at the lung bases. Hepatobiliary: Normal liver size. No liver mass. Normal gallbladder with no radiopaque cholelithiasis. No biliary ductal dilatation. Pancreas: Normal, with no mass or duct dilation. Spleen: Normal size. No mass. Adrenals/Urinary Tract: Normal adrenals. Normal kidneys with no hydronephrosis and no renal mass. Normal bladder. Stomach/Bowel: Normal non-distended stomach. Normal caliber small bowel with no small bowel wall thickening. Normal appendix. Moderate left colonic diverticulosis. There is moderate wall thickening in the proximal sigmoid colon with associated pericolonic fat stranding and ill-defined fluid. No additional sites of large bowel wall thickening. Vascular/Lymphatic: Atherosclerotic nonaneurysmal abdominal aorta. Patent portal, splenic, hepatic and renal veins. No pathologically enlarged lymph nodes in the abdomen or pelvis. Reproductive: Top-normal size prostate with nonspecific internal prostatic calcification. Other: There is scattered free air under the anterior right hemidiaphragm. No focal fluid collection. Trace ascites in the left pericolic gutter. Musculoskeletal: No aggressive appearing focal osseous lesions. Mild thoracolumbar spondylosis. IMPRESSION: 1. Acute perforated sigmoid diverticulitis with free air under the right hemidiaphragm. No abscess. 2. Trace ascites in the left paracolic gutter. 3. Aortic Atherosclerosis (ICD10-I70.0) and Emphysema (ICD10-J43.9). Critical Value/emergent results were called by  telephone at the time of interpretation on 10/16/2017 at 7:55 pm to DR. JDorie Rank Who verbally acknowledged these results. Electronically Signed   By: JIlona SorrelM.D.   On: 10/16/2017 19:57    Pending Labs [employer](From admission, onward)    Start     Ordered   Signed and Held  HIV antibody (Routine Testing)  Once,   R     Signed and Held   Signed and Held  Basic metabolic panel  Tomorrow morning,   R     Signed and Held   Signed and Held  CBC  Tomorrow morning,   R     Signed and Held          Vitals/Pain Today's Vitals   10/16/17 2230 10/16/17 2300 10/16/17 2313 10/16/17 2316  BP: (!) 149/83 135/79 135/79  Pulse: (!) 106 (!) 107 (!) 107   Resp: 19  16   Temp:      TempSrc:      SpO2: 94% 96% 97%   Weight:      PainSc:    6     Isolation Precautions No active isolations  Medications Medications  iopamidol (ISOVUE-300) 61 % injection (has no administration in time range)  morphine 4 MG/ML injection 4 mg (4 mg Intravenous Given 10/16/17 1826)  ondansetron (ZOFRAN) injection 4 mg (4 mg Intravenous Given 10/16/17 1841)  sodium chloride 0.9 % bolus 1,000 mL (0 mLs Intravenous Stopped 10/16/17 2315)  morphine 4 MG/ML injection 4 mg (4 mg Intravenous Given 10/16/17 1858)  iopamidol (ISOVUE-300) 61 % injection 100 mL (100 mLs Intravenous Contrast Given 10/16/17 1925)  piperacillin-tazobactam (ZOSYN) IVPB 3.375 g (0 g Intravenous Stopped 10/16/17 2316)  LORazepam (ATIVAN) injection 1 mg (1 mg Intravenous Given 10/16/17 2040)  HYDROmorphone (DILAUDID) injection 1 mg (1 mg Intravenous Given 10/16/17 2041)    Mobility walks

## 2017-10-16 NOTE — ED Provider Notes (Signed)
Medical screening examination/treatment/procedure(s) were conducted as a shared visit with non-physician practitioner(s) and myself.  I personally evaluated the patient during the encounter.  Pt presented with complaints of abdominal pain.  Known history of diverticulitis.   CT scan shows perforated sigmoid diverticulitis without signs of abscess.  Discussed findings with patient and wife. Will start on abx, consult with general surgery.   Linwood DibblesKnapp, Wilbern Pennypacker, MD 10/16/17 2016

## 2017-10-16 NOTE — ED Notes (Signed)
Pt made aware urine needed. Pt provided with urinal 

## 2017-10-17 ENCOUNTER — Encounter (HOSPITAL_COMMUNITY): Payer: Self-pay

## 2017-10-17 ENCOUNTER — Other Ambulatory Visit: Payer: Self-pay

## 2017-10-17 LAB — CBC
HCT: 40.8 % (ref 39.0–52.0)
Hemoglobin: 13.6 g/dL (ref 13.0–17.0)
MCH: 33.2 pg (ref 26.0–34.0)
MCHC: 33.3 g/dL (ref 30.0–36.0)
MCV: 99.5 fL (ref 78.0–100.0)
PLATELETS: 364 10*3/uL (ref 150–400)
RBC: 4.1 MIL/uL — ABNORMAL LOW (ref 4.22–5.81)
RDW: 14.3 % (ref 11.5–15.5)
WBC: 22.3 10*3/uL — ABNORMAL HIGH (ref 4.0–10.5)

## 2017-10-17 LAB — BASIC METABOLIC PANEL
Anion gap: 8 (ref 5–15)
BUN: 11 mg/dL (ref 6–20)
CALCIUM: 8.1 mg/dL — AB (ref 8.9–10.3)
CHLORIDE: 108 mmol/L (ref 98–111)
CO2: 22 mmol/L (ref 22–32)
CREATININE: 1 mg/dL (ref 0.61–1.24)
GFR calc Af Amer: >60 mL/min (ref >60)
GFR calc non Af Amer: >60 mL/min (ref >60)
Glucose, Bld: 109 mg/dL — ABNORMAL HIGH (ref 70–99)
Potassium: 4.5 mmol/L (ref 3.5–5.1)
Sodium: 138 mmol/L (ref 135–145)

## 2017-10-17 LAB — HIV ANTIBODY (ROUTINE TESTING W REFLEX): HIV Screen 4th Generation wRfx: NONREACTIVE

## 2017-10-17 MED ORDER — HYDRALAZINE HCL 20 MG/ML IJ SOLN
10.0000 mg | INTRAMUSCULAR | Status: DC | PRN
  Administered 2017-10-19 – 2017-10-21 (×3): 10 mg via INTRAVENOUS
  Filled 2017-10-17 (×3): qty 1

## 2017-10-17 NOTE — Progress Notes (Signed)
[facility]/Trauma Progress Note      Assessment/Plan  Situation stress disorder - home ativan and wellbutrin HTN - hydralizine PRN  Perforated sigmoid diverticulitis - free air seen on CT - bowel rest, IV abx, IVF - hopefully this will resolve without need for surgery  FEN: NPO, ice chips, sips with meds, IVF VTE: SCD's, lovenox ID: Zosyn 09/24>> Foley: none Follow up: TBD  DISPO: continue IV abx, IVF and bowel rest until pain improves.     LOS: 1 day    Subjective: CC: LLQ abdominal pain  Pt states pain is improved since admission. He was worried that it was blueberries or popcorn that caused this. He has been stressed due to family issues. His first episode was after his father died. He sees Dr. Ewing Schlein, GI. Fiancee at bedside. Pt was tearful when discussing his father's death and his family stressors.   Objective: Vital signs in last 24 hours: Temp:  [99.1 F (37.3 C)-99.8 F (37.7 C)] 99.2 F (37.3 C) (09/25 0810) Pulse Rate:  [91-109] 97 (09/25 0810) Resp:  [16-19] 17 (09/25 0810) BP: (135-188)/(79-112) 158/89 (09/25 0810) SpO2:  [94 %-100 %] 97 % (09/25 0810) Weight:  [79.4 kg] 79.4 kg (09/24 1816) Last BM Date: 10/16/17  Intake/Output from previous day: 09/24 0701 - 09/25 0700 In: 771 [I.V.:761; IV Piggyback:10] Out: 725 [Urine:725] Intake/Output this shift: Total I/O In: -  Out: 375 [Urine:375]  PE: Gen:  Alert, NAD, pleasant, cooperative Card:  RRR Pulm:  Rate and effort normal Abd: Soft, not distended, +BS, no HSM, TTP in LLQ and suprapubic region with guarding, no signs of peritonitis  Skin: no rashes noted, warm and dry   Anti-infectives: Anti-infectives (From admission, onward)   Start     Dose/Rate Route Frequency Ordered Stop   10/17/17 0600  piperacillin-tazobactam (ZOSYN) IVPB 3.375 g     3.375 g 12.5 mL/hr over 240 Minutes Intravenous Every 8 hours 10/16/17 2336     10/16/17 2000  piperacillin-tazobactam (ZOSYN) IVPB  3.375 g     3.375 g 100 mL/hr over 30 Minutes Intravenous  Once 10/16/17 1957 10/16/17 2316      Lab Results:  Recent Labs    10/16/17 1826 10/17/17 0428  WBC 18.1* 22.3*  HGB 14.6 13.6  HCT 43.2 40.8  PLT 388 364   BMET Recent Labs    10/16/17 1826 10/17/17 0428  NA 140 138  K 3.9 4.5  CL 106 108  CO2 22 22  GLUCOSE 115* 109*  BUN 14 11  CREATININE 1.03 1.00  CALCIUM 9.1 8.1*   PT/INR No results for input(s): LABPROT, INR in the last 72 hours. CMP     Component Value Date/Time   NA 138 10/17/2017 0428   K 4.5 10/17/2017 0428   CL 108 10/17/2017 0428   CO2 22 10/17/2017 0428   GLUCOSE 109 (H) 10/17/2017 0428   BUN 11 10/17/2017 0428   CREATININE 1.00 10/17/2017 0428   CALCIUM 8.1 (L) 10/17/2017 0428   PROT 7.6 10/16/2017 1826   ALBUMIN 4.3 10/16/2017 1826   AST 19 10/16/2017 1826   ALT 23 10/16/2017 1826   ALKPHOS 42 10/16/2017 1826   BILITOT 0.6 10/16/2017 1826   GFRNONAA >60 10/17/2017 0428   GFRAA >60 10/17/2017 0428   Lipase     Component Value Date/Time   LIPASE 35 10/16/2017 1826    Studies/Results: Ct Abdomen Pelvis W Contrast  Result Date: 10/16/2017 CLINICAL DATA:  Abdominal pain. EXAM: CT ABDOMEN AND  PELVIS WITH CONTRAST TECHNIQUE: Multidetector CT imaging of the abdomen and pelvis was performed using the standard protocol following bolus administration of intravenous contrast. CONTRAST:  ISOVUE-300 IOPAMIDOL (ISOVUE-300) INJECTION 61% COMPARISON:  06/10/2017 CT abdomen/pelvis. FINDINGS: Lower chest: Centrilobular emphysema and scattered parenchymal bands at the lung bases. Hepatobiliary: Normal liver size. No liver mass. Normal gallbladder with no radiopaque cholelithiasis. No biliary ductal dilatation. Pancreas: Normal, with no mass or duct dilation. Spleen: Normal size. No mass. Adrenals/Urinary Tract: Normal adrenals. Normal kidneys with no hydronephrosis and no renal mass. Normal bladder. Stomach/Bowel: Normal non-distended stomach.  Normal caliber small bowel with no small bowel wall thickening. Normal appendix. Moderate left colonic diverticulosis. There is moderate wall thickening in the proximal sigmoid colon with associated pericolonic fat stranding and ill-defined fluid. No additional sites of large bowel wall thickening. Vascular/Lymphatic: Atherosclerotic nonaneurysmal abdominal aorta. Patent portal, splenic, hepatic and renal veins. No pathologically enlarged lymph nodes in the abdomen or pelvis. Reproductive: Top-normal size prostate with nonspecific internal prostatic calcification. Other: There is scattered free air under the anterior right hemidiaphragm. No focal fluid collection. Trace ascites in the left pericolic gutter. Musculoskeletal: No aggressive appearing focal osseous lesions. Mild thoracolumbar spondylosis. IMPRESSION: 1. Acute perforated sigmoid diverticulitis with free air under the right hemidiaphragm. No abscess. 2. Trace ascites in the left paracolic gutter. 3. Aortic Atherosclerosis (ICD10-I70.0) and Emphysema (ICD10-J43.9). Critical Value/emergent results were called by telephone at the time of interpretation on 10/16/2017 at 7:55 pm to DR. Linwood Dibbles, Who verbally acknowledged these results. Electronically Signed   By: Delbert Phenix M.D.   On: 10/16/2017 19:57      Jerre Simon , [facility] 10/17/2017, 10:32 AM  Pager: [phone] Mon-Wed, Friday 7:00am-4:30pm Thurs 7am-11:30am  Consults: [phone]

## 2017-10-18 LAB — BASIC METABOLIC PANEL
Anion gap: 7 (ref 5–15)
BUN: 7 mg/dL (ref 6–20)
CO2: 25 mmol/L (ref 22–32)
Calcium: 8.5 mg/dL — ABNORMAL LOW (ref 8.9–10.3)
Chloride: 107 mmol/L (ref 98–111)
Creatinine, Ser: 1.02 mg/dL (ref 0.61–1.24)
GFR calc Af Amer: >60 mL/min (ref >60)
GFR calc non Af Amer: >60 mL/min (ref >60)
Glucose, Bld: 129 mg/dL — ABNORMAL HIGH (ref 70–99)
POTASSIUM: 3.9 mmol/L (ref 3.5–5.1)
SODIUM: 139 mmol/L (ref 135–145)

## 2017-10-18 LAB — CBC
HEMATOCRIT: 38.9 % — AB (ref 39.0–52.0)
Hemoglobin: 12.7 g/dL — ABNORMAL LOW (ref 13.0–17.0)
MCH: 32.5 pg (ref 26.0–34.0)
MCHC: 32.6 g/dL (ref 30.0–36.0)
MCV: 99.5 fL (ref 78.0–100.0)
Platelets: 351 10*3/uL (ref 150–400)
RBC: 3.91 MIL/uL — ABNORMAL LOW (ref 4.22–5.81)
RDW: 14.5 % (ref 11.5–15.5)
WBC: 18.3 10*3/uL — ABNORMAL HIGH (ref 4.0–10.5)

## 2017-10-18 MED ORDER — OXYCODONE HCL 5 MG PO TABS
5.0000 mg | ORAL_TABLET | ORAL | Status: DC | PRN
  Administered 2017-10-18 – 2017-10-21 (×8): 10 mg via ORAL
  Administered 2017-10-22: 5 mg via ORAL
  Administered 2017-10-22: 10 mg via ORAL
  Administered 2017-10-23: 5 mg via ORAL
  Filled 2017-10-18 (×2): qty 2
  Filled 2017-10-18: qty 1
  Filled 2017-10-18 (×6): qty 2
  Filled 2017-10-18: qty 1
  Filled 2017-10-18 (×3): qty 2

## 2017-10-18 MED ORDER — ACETAMINOPHEN 500 MG PO TABS
1000.0000 mg | ORAL_TABLET | Freq: Three times a day (TID) | ORAL | Status: DC
  Administered 2017-10-18 – 2017-10-20 (×5): 1000 mg via ORAL
  Filled 2017-10-18 (×6): qty 2

## 2017-10-18 NOTE — Progress Notes (Signed)
    CC: Abdominal pain  Subjective: Patient feels slightly better pains ongoing left lower quadrant.  Objective: Vital signs in last 24 hours: Temp:  [98.7 F (37.1 C)-100.9 F (38.3 C)] 99.5 F (37.5 C) (09/26 0503) Pulse Rate:  [86-93] 86 (09/26 0503) Resp:  [18] 18 (09/26 0503) BP: (152-171)/(78-108) 160/87 (09/26 0503) SpO2:  [94 %-98 %] 96 % (09/26 0503) Weight:  [79.4 kg] 79.4 kg (09/25 1700) Last BM Date: 10/16/17 50 p.o. Recorded 3100 IV 3300 urine No BM recorded Low-grade fever 100.9 at 1900, 100.4 at 0 300, down to 99.5 at 5 AM this morning. Mild blood pressure elevation.  Heart rate and sats are good.  BMP is stable glucose slightly elevated.  WBC 18.3  H/H down slightly CT scan 9/24:Acute perforated sigmoid diverticulitis with free air under the right hemidiaphragm. No abscess.  Trace ascites in the left paracolic gutter. Aortic Atherosclerosis/ Emphysema     Intake/Output from previous day: 09/25 0701 - 09/26 0700 In: 3130.1 [P.O.:50; I.V.:2940; IV Piggyback:140] Out: 3300 [Urine:3300] Intake/Output this shift: Total I/O In: 267.1 [I.V.:248.6; IV Piggyback:18.5] Out: -   General appearance: alert, cooperative and no distress Resp: clear to auscultation bilaterally GI: Soft, tender in the left lower quadrant, ongoing abdominal discomfort.  Lab Results:  Recent Labs    10/17/17 0428 10/18/17 0434  WBC 22.3* 18.3*  HGB 13.6 12.7*  HCT 40.8 38.9*  PLT 364 351    BMET Recent Labs    10/17/17 0428 10/18/17 0434  NA 138 139  K 4.5 3.9  CL 108 107  CO2 22 25  GLUCOSE 109* 129*  BUN 11 7  CREATININE 1.00 1.02  CALCIUM 8.1* 8.5*   PT/INR No results for input(s): LABPROT, INR in the last 72 hours.  Recent Labs  Lab 10/16/17 1826  AST 19  ALT 23  ALKPHOS 42  BILITOT 0.6  PROT 7.6  ALBUMIN 4.3     Lipase     Component Value Date/Time   LIPASE 35 10/16/2017 1826     Medications: . buPROPion  200 mg Oral BID  . enoxaparin  (LOVENOX) injection  40 mg Subcutaneous Q24H    Assessment/Plan Situation stress disorder - home ativan and wellbutrin HTN - hydralizine PRN  Perforated sigmoid diverticulitis - free air seen on CT - bowel rest, IV abx, IVF - hopefully this will resolve without need for surgery  FEN: NPO, ice chips, sips with meds, IVF VTE: SCD's, lovenox ID: Zosyn 09/24>> day 3 Foley: none Follow up: TBD   Plan: Start him on some clears this morning, continue bowel rest, continue antibiotics recheck labs in the a.m.      LOS: 2 days    Shanie Mauzy 10/18/2017 [phone]

## 2017-10-19 ENCOUNTER — Inpatient Hospital Stay (HOSPITAL_COMMUNITY): Payer: BC Managed Care – PPO

## 2017-10-19 LAB — BASIC METABOLIC PANEL
Anion gap: 7 (ref 5–15)
BUN: 5 mg/dL — ABNORMAL LOW (ref 6–20)
CALCIUM: 8.4 mg/dL — AB (ref 8.9–10.3)
CO2: 24 mmol/L (ref 22–32)
Chloride: 107 mmol/L (ref 98–111)
Creatinine, Ser: 0.99 mg/dL (ref 0.61–1.24)
GLUCOSE: 127 mg/dL — AB (ref 70–99)
Potassium: 3.8 mmol/L (ref 3.5–5.1)
SODIUM: 138 mmol/L (ref 135–145)

## 2017-10-19 LAB — CBC
HCT: 36.7 % — ABNORMAL LOW (ref 39.0–52.0)
Hemoglobin: 12.1 g/dL — ABNORMAL LOW (ref 13.0–17.0)
MCH: 33.1 pg (ref 26.0–34.0)
MCHC: 33 g/dL (ref 30.0–36.0)
MCV: 100.3 fL — ABNORMAL HIGH (ref 78.0–100.0)
PLATELETS: 375 10*3/uL (ref 150–400)
RBC: 3.66 MIL/uL — ABNORMAL LOW (ref 4.22–5.81)
RDW: 14.3 % (ref 11.5–15.5)
WBC: 20 10*3/uL — AB (ref 4.0–10.5)

## 2017-10-19 MED ORDER — IOHEXOL 300 MG/ML  SOLN
100.0000 mL | Freq: Once | INTRAMUSCULAR | Status: AC | PRN
  Administered 2017-10-19: 100 mL via INTRAVENOUS

## 2017-10-19 MED ORDER — SODIUM CHLORIDE 0.9 % IJ SOLN
INTRAMUSCULAR | Status: AC
  Filled 2017-10-19: qty 50

## 2017-10-19 NOTE — Progress Notes (Signed)
    CC: Abdominal pain  Subjective: His pain is a little bit better, and much better than admission.  Still hurts to sit up.  He is tolerating clears and says he is passing a little more flatus.  No BM.  Objective: Vital signs in last 24 hours: Temp:  [98.7 F (37.1 C)-99.1 F (37.3 C)] 98.9 F (37.2 C) (09/27 0609) Pulse Rate:  [83-87] 87 (09/27 0609) Resp:  [18] 18 (09/27 0609) BP: (151-177)/(91-100) 177/99 (09/27 0609) SpO2:  [96 %-99 %] 96 % (09/27 0609) Last BM Date: 10/18/17 240 p.o. Recorded 3000 IV 3050 urine No BM recorded T-max 99.5 BP is still up. WBC up to 20,000. Electrolytes are normal. CT 9/24 Intake/Output from previous day: 09/26 0701 - 09/27 0700 In: 3209.9 [P.O.:240; I.V.:2749.9; IV Piggyback:220] Out: 3050 [Urine:3050] Intake/Output this shift: No intake/output data recorded.  General appearance: alert, cooperative and no distress Resp: clear to auscultation bilaterally GI: Soft, still tender in the left lower quadrant.  Passing some flatus.  Still hurts to move around.  Lab Results:  Recent Labs    10/18/17 0434 10/19/17 0348  WBC 18.3* 20.0*  HGB 12.7* 12.1*  HCT 38.9* 36.7*  PLT 351 375    BMET Recent Labs    10/18/17 0434 10/19/17 0348  NA 139 138  K 3.9 3.8  CL 107 107  CO2 25 24  GLUCOSE 129* 127*  BUN 7 5*  CREATININE 1.02 0.99  CALCIUM 8.5* 8.4*   PT/INR No results for input(s): LABPROT, INR in the last 72 hours.  Recent Labs  Lab 10/16/17 1826  AST 19  ALT 23  ALKPHOS 42  BILITOT 0.6  PROT 7.6  ALBUMIN 4.3     Lipase     Component Value Date/Time   LIPASE 35 10/16/2017 1826     Medications: . acetaminophen  1,000 mg Oral Q8H  . buPROPion  200 mg Oral BID  . enoxaparin (LOVENOX) injection  40 mg Subcutaneous Q24H    Assessment/Plan  Situation stress disorder - home ativan and wellbutrin HTN -hydralizine PRN  Perforated sigmoid diverticulitis - free air seen on CT - bowel rest, IV abx, IVF -  hopefully this will resolve without need for surgery  WUJ:WJXBJY, IVF VTE: SCD's, lovenox NW:GNFAO 09/24>> day 4 Foley:none Follow up:TBD  Plan: His WBC is up, but he symptomatically better.  We will repeat CT scan today labs in a.m.     LOS: 3 days    Jayanna Kroeger 10/19/2017 [phone]

## 2017-10-20 DIAGNOSIS — Z860100 Personal history of colon polyps, unspecified: Secondary | ICD-10-CM

## 2017-10-20 LAB — BASIC METABOLIC PANEL
ANION GAP: 7 (ref 5–15)
BUN: <5 mg/dL — ABNORMAL LOW (ref 6–20)
CO2: 25 mmol/L (ref 22–32)
Calcium: 9.1 mg/dL (ref 8.9–10.3)
Chloride: 109 mmol/L (ref 98–111)
Creatinine, Ser: 0.96 mg/dL (ref 0.61–1.24)
GFR calc Af Amer: >60 mL/min (ref >60)
GFR calc non Af Amer: >60 mL/min (ref >60)
Glucose, Bld: 111 mg/dL — ABNORMAL HIGH (ref 70–99)
POTASSIUM: 3.6 mmol/L (ref 3.5–5.1)
Sodium: 141 mmol/L (ref 135–145)

## 2017-10-20 LAB — CBC
HEMATOCRIT: 38.8 % — AB (ref 39.0–52.0)
Hemoglobin: 12.9 g/dL — ABNORMAL LOW (ref 13.0–17.0)
MCH: 32.9 pg (ref 26.0–34.0)
MCHC: 33.2 g/dL (ref 30.0–36.0)
MCV: 99 fL (ref 78.0–100.0)
Platelets: 443 10*3/uL — ABNORMAL HIGH (ref 150–400)
RBC: 3.92 MIL/uL — AB (ref 4.22–5.81)
RDW: 14.2 % (ref 11.5–15.5)
WBC: 16 10*3/uL — ABNORMAL HIGH (ref 4.0–10.5)

## 2017-10-20 MED ORDER — PSYLLIUM 95 % PO PACK
1.0000 | PACK | Freq: Every day | ORAL | Status: DC
  Administered 2017-10-20 – 2017-10-23 (×4): 1 via ORAL
  Filled 2017-10-20 (×5): qty 1

## 2017-10-20 MED ORDER — PROCHLORPERAZINE EDISYLATE 10 MG/2ML IJ SOLN: 5.0000 mg | INTRAMUSCULAR | Status: DC | PRN

## 2017-10-20 MED ORDER — GABAPENTIN 300 MG PO CAPS
300.0000 mg | ORAL_CAPSULE | Freq: Every day | ORAL | Status: AC
  Administered 2017-10-20 – 2017-10-22 (×3): 300 mg via ORAL
  Filled 2017-10-20 (×3): qty 1

## 2017-10-20 MED ORDER — ACETAMINOPHEN 500 MG PO TABS
1000.0000 mg | ORAL_TABLET | Freq: Four times a day (QID) | ORAL | Status: DC
  Administered 2017-10-20 – 2017-10-24 (×8): 1000 mg via ORAL
  Filled 2017-10-20 (×13): qty 2

## 2017-10-20 MED ORDER — MAGIC MOUTHWASH
15.0000 mL | Freq: Four times a day (QID) | ORAL | Status: DC | PRN
  Filled 2017-10-20: qty 15

## 2017-10-20 MED ORDER — GUAIFENESIN-DM 100-10 MG/5ML PO SYRP: 10.0000 mL | ORAL_SOLUTION | ORAL | Status: DC | PRN

## 2017-10-20 MED ORDER — SACCHAROMYCES BOULARDII 250 MG PO CAPS
250.0000 mg | ORAL_CAPSULE | Freq: Two times a day (BID) | ORAL | Status: DC
  Administered 2017-10-20 – 2017-10-24 (×9): 250 mg via ORAL
  Filled 2017-10-20 (×10): qty 1

## 2017-10-20 MED ORDER — BISACODYL 10 MG RE SUPP: 10.0000 mg | Freq: Two times a day (BID) | RECTAL | Status: DC | PRN

## 2017-10-20 MED ORDER — LIP MEDEX EX OINT
1.0000 "application " | TOPICAL_OINTMENT | Freq: Two times a day (BID) | CUTANEOUS | Status: DC
  Administered 2017-10-20 – 2017-10-23 (×7): 1 via TOPICAL
  Filled 2017-10-20 (×3): qty 7

## 2017-10-20 MED ORDER — MENTHOL 3 MG MT LOZG
1.0000 | LOZENGE | OROMUCOSAL | Status: DC | PRN
  Filled 2017-10-20: qty 9

## 2017-10-20 MED ORDER — METHOCARBAMOL 1000 MG/10ML IJ SOLN
1000.0000 mg | Freq: Four times a day (QID) | INTRAVENOUS | Status: DC | PRN
  Filled 2017-10-20: qty 10

## 2017-10-20 MED ORDER — HYDROCORTISONE 2.5 % RE CREA: 1.0000 "application " | TOPICAL_CREAM | Freq: Four times a day (QID) | RECTAL | Status: DC | PRN

## 2017-10-20 MED ORDER — HYDROCORTISONE 1 % EX CREA: 1.0000 "application " | TOPICAL_CREAM | Freq: Three times a day (TID) | CUTANEOUS | Status: DC | PRN

## 2017-10-20 MED ORDER — TRAMADOL HCL 50 MG PO TABS: 50.0000 mg | ORAL_TABLET | Freq: Four times a day (QID) | ORAL | Status: DC | PRN

## 2017-10-20 MED ORDER — LACTATED RINGERS IV BOLUS: 1000.0000 mL | Freq: Three times a day (TID) | INTRAVENOUS | Status: DC | PRN

## 2017-10-20 MED ORDER — PHENOL 1.4 % MT LIQD: 1.0000 | OROMUCOSAL | Status: DC | PRN

## 2017-10-20 MED ORDER — ALUM & MAG HYDROXIDE-SIMETH 200-200-20 MG/5ML PO SUSP: 30.0000 mL | Freq: Four times a day (QID) | ORAL | Status: DC | PRN

## 2017-10-20 NOTE — Progress Notes (Signed)
Caleb Roman [id number] 08/29/1960  CARE TEAM:  PCP: Serita Grammes, MD  Outpatient Care Team: Patient Care Team: Serita Grammes, MD as PCP - General (Family Medicine) Clarene Essex, MD as Consulting Physician (Gastroenterology)  Inpatient Treatment Team: Treatment Team: Attending Provider: Edison Pace, Md, MD; Technician: Wonda Cheng, NT; Consulting Physician: Alphonsa Overall, MD; Consulting Physician: Edison Pace, Md, MD; Technician: Leda Quail, NT; Technician: Reuel Derby, NT; Registered Nurse: Vicente Serene, RN   Problem List:   Principal Problem:   Diverticulitis of colon with perforation Active Problems:   Anxiety   Essential hypertension   Hyperlipidemia      * No surgery found [facility] Stay = 4 days  Assessment  Improving  Plan:  -try full liquid diet -less pain w BMs and falling WBC hopeful sign.  Doubt much to drain w small contained abscess.  If he worsens or leukocytosis worsens, can revisit considering interventional radiology aspiration. -anxiolysis -PRN BP meds. -VTE prophylaxis- SCDs, etc -mobilize as tolerated to help recovery  At some point, he would benefit from elective colonic resection to break the cycles of diverticulitis.  This most recent attack is complicated with contained abscess.  Ideally would wait 6-8 weeks from now, repeat colonoscopy given his history of colon polyps with last colonoscopy in 2016.  Then elective colectomy.  Reasonable for a minimally invasive approach potentially.  Discussed with him.  While he is anxious to consider surgery, he does not want to go through this attack again.  His wife strongly favors elective resection to break the cycle attacks.  He will think about things.  Fortunately, he seems to finally be improving and does not need emergency surgery/Hartmann resection.  We will see.    30 minutes spent in review, evaluation, examination, counseling, and coordination of care.  More than 50% of that  time was spent in counseling.  10/20/2017    Subjective: (Chief complaint)  Pain much less.  Had some bowel movements.  Feeling hungry.  Wife in room.  Walking in hallways  Objective:  Vital signs:  Vitals:   10/19/17 1510 10/19/17 2129 10/19/17 2356 10/20/17 0459  BP: (!) 190/106 (!) 188/100 (!) 168/95 (!) 184/98  Pulse: 79 67 78 77  Resp: _0 Temp: 99 F (37.2 C) 98.5 F (36.9 C)  98.3 F (36.8 C)  TempSrc: Oral Oral  Oral  SpO2: 100% 98%  97%  Weight:      Height:        Last BM Date: 10/18/17  Intake/Output   Yesterday:  09/27 0701 - 09/28 0700 In: 2729.4 [P.O.:660; I.V.:1919.4; IV Piggyback:150] Out: 4967 [Urine:4600] This shift:  Total I/O In: 120 [P.O.:120] Out: 300 [Urine:300]  Bowel function:  Flatus: YES  BM:  YES  Drain: (No drain)   Physical Exam:  General: Pt awake/alert/oriented x4 in no acute distress Eyes: PERRL, normal EOM.  Sclera clear.  No icterus Neuro: CN II-XII intact w/o focal sensory/motor deficits. Lymph: No head/neck/groin lymphadenopathy Psych:  No delerium/psychosis/paranoia HENT: Normocephalic, Mucus membranes moist.  No thrush Neck: Supple, No tracheal deviation Chest: No chest wall pain w good excursion CV:  Pulses intact.  Regular rhythm MS: Normal AROM mjr joints.  No obvious deformity  Abdomen: Soft.  Nondistended.  Tenderness at Left lower quadrant minimal.  No guarding..  No evidence of peritonitis.  No incarcerated hernias.  Ext:  No deformity.  No mjr edema.  No cyanosis Skin: No petechiae /  purpura  Results:   Labs: Results for orders placed or performed during the hospital encounter of 10/16/17 (from the past 48 hour(s))  CBC     Status: Abnormal   Collection Time: 10/19/17  3:48 AM  Result Value Ref Range   WBC 20.0 (H) 4.0 - 10.5 K/uL    Comment: WHITE COUNT CONFIRMED ON SMEAR   RBC 3.66 (L) 4.22 - 5.81 MIL/uL   Hemoglobin 12.1 (L) 13.0 - 17.0 g/dL   HCT 36.7 (L) 39.0 - 52.0 %   MCV  100.3 (H) 78.0 - 100.0 fL   MCH 33.1 26.0 - 34.0 pg   MCHC 33.0 30.0 - 36.0 g/dL   RDW 14.3 11.5 - 15.5 %   Platelets 375 150 - 400 K/uL    Comment: Performed at [facility], [place] [address], [place], [place] [address]  Basic metabolic panel     Status: Abnormal   Collection Time: 10/19/17  3:48 AM  Result Value Ref Range   Sodium 138 135 - 145 mmol/L   Potassium 3.8 3.5 - 5.1 mmol/L   Chloride 107 98 - 111 mmol/L   CO2 24 22 - 32 mmol/L   Glucose, Bld 127 (H) 70 - 99 mg/dL   BUN 5 (L) 6 - 20 mg/dL   Creatinine, Ser 0.99 0.61 - 1.24 mg/dL   Calcium 8.4 (L) 8.9 - 10.3 mg/dL   GFR calc non Af Amer >60 >60 mL/min   GFR calc Af Amer >60 >60 mL/min    Comment: (NOTE) The eGFR has been calculated using the CKD EPI equation. This calculation has not been validated in all clinical situations. eGFR's persistently <60 mL/min signify possible Chronic Kidney Disease.    Anion gap 7 5 - 15    Comment: Performed at [facility], [place] [address] [place], [place] [address]  CBC     Status: Abnormal   Collection Time: 10/20/17  3:28 AM  Result Value Ref Range   WBC 16.0 (H) 4.0 - 10.5 K/uL   RBC 3.92 (L) 4.22 - 5.81 MIL/uL   Hemoglobin 12.9 (L) 13.0 - 17.0 g/dL   HCT 38.8 (L) 39.0 - 52.0 %   MCV 99.0 78.0 - 100.0 fL   MCH 32.9 26.0 - 34.0 pg   MCHC 33.2 30.0 - 36.0 g/dL   RDW 14.2 11.5 - 15.5 %   Platelets 443 (H) 150 - 400 K/uL    Comment: Performed at [facility], [place] [address] [place], [place] [address]  Basic metabolic panel     Status: Abnormal   Collection Time: 10/20/17  3:28 AM  Result Value Ref Range   Sodium 141 135 - 145 mmol/L   Potassium 3.6 3.5 - 5.1 mmol/L   Chloride 109 98 - 111 mmol/L   CO2 25 22 - 32 mmol/L   Glucose, Bld 111 (H) 70 - 99 mg/dL   BUN <5 (L) 6 - 20 mg/dL   Creatinine, Ser 0.96 0.61 - 1.24 mg/dL   Calcium 9.1 8.9 - 10.3 mg/dL   GFR calc non Af Amer >60 >60 mL/min   GFR calc Af Amer >60 >60  mL/min    Comment: (NOTE) The eGFR has been calculated using the CKD EPI equation. This calculation has not been validated in all clinical situations. eGFR's persistently <60 mL/min signify possible Chronic Kidney Disease.    Anion gap 7 5 - 15    Comment: Performed at [facility], [place]  [address], [place], [place] [address]    Imaging / Studies: Ct Abdomen Pelvis W Contrast  Result Date: 10/19/2017 CLINICAL DATA:  Low grade fever. History of perforated diverticulitis. EXAM: CT ABDOMEN AND PELVIS WITH CONTRAST TECHNIQUE: Multidetector CT imaging of the abdomen and pelvis was performed using the standard protocol following bolus administration of intravenous contrast. CONTRAST:  177m OMNIPAQUE IOHEXOL 300 MG/ML  SOLN COMPARISON:  CT, 10/16/2017. FINDINGS: Lower chest: Lung base atelectasis, mildly increased from the prior CT. Trace right pleural effusion. No convincing pneumonia. Heart normal size. Hepatobiliary: No focal liver abnormality is seen. No gallstones, gallbladder wall thickening, or biliary dilatation. Pancreas: Unremarkable. No pancreatic ductal dilatation or surrounding inflammatory changes. Spleen: Relatively small spleen. No splenic mass or focal lesion. Stable from the prior study. Adrenals/Urinary Tract: Adrenal glands are unremarkable. Kidneys are normal, without renal calculi, focal lesion, or hydronephrosis. Bladder is unremarkable. Stomach/Bowel: Changes of sigmoid diverticulitis described previously are similar. There is wall thickening of the proximal sigmoid colon adjacent inflammation. There is a small fluid collection, now better defined, containing bubbles of air, along the anterior inferior margin of the proximal sigmoid colon, measuring 2.9 x 1.5 x 1.5 cm. No free air. There are numerous colonic diverticula. No other areas of diverticulitis. No evidence of bowel obstruction. Small bowel is normal in caliber with no wall thickening or inflammation.  Stomach is unremarkable. Normal appendix is visualized. Vascular/Lymphatic: Aortic atherosclerosis. No enlarged abdominal or pelvic lymph nodes. Reproductive: Unremarkable. Other: Trace pelvic free fluid associated with the sigmoid colon inflammation, similar to the prior exam. Musculoskeletal: No acute or significant osseous findings. IMPRESSION: 1. Persistent sigmoid colon diverticulitis. Small pericolonic abscess has developed along the anterior inferior margin of the proximal sigmoid colon measuring 2.9 cm in greatest dimension. The overall degree of inflammation is stable from the recent prior CT. There are no new areas of colonic inflammation. No free air. 2. Mild increase in lung base atelectasis. Trace right pleural effusion new from the prior study. 3. No other change. Electronically Signed   By: DLajean ManesM.D.   On: 10/19/2017 15:34    Medications / Allergies: per chart  Antibiotics: Anti-infectives (From admission, onward)   Start     Dose/Rate Route Frequency Ordered Stop   10/17/17 0600  piperacillin-tazobactam (ZOSYN) IVPB 3.375 g     3.375 g 12.5 mL/hr over 240 Minutes Intravenous Every 8 hours 10/16/17 2336     10/16/17 2000  piperacillin-tazobactam (ZOSYN) IVPB 3.375 g     3.375 g 100 mL/hr over 30 Minutes Intravenous  Once 10/16/17 1957 10/16/17 2316        Note: Portions of this report may have been transcribed using voice recognition software. Every effort was made to ensure accuracy; however, inadvertent computerized transcription errors may be present.   Any transcriptional errors that result from this process are unintentional.     SAdin Hector MD, FACS, MASCRS Gastrointestinal and Minimally Invasive Surgery    [address] [place] [phone] / Paging [phone]

## 2017-10-21 LAB — CBC
HCT: 37.9 % — ABNORMAL LOW (ref 39.0–52.0)
Hemoglobin: 12.5 g/dL — ABNORMAL LOW (ref 13.0–17.0)
MCH: 32.7 pg (ref 26.0–34.0)
MCHC: 33 g/dL (ref 30.0–36.0)
MCV: 99.2 fL (ref 78.0–100.0)
PLATELETS: 462 10*3/uL — AB (ref 150–400)
RBC: 3.82 MIL/uL — ABNORMAL LOW (ref 4.22–5.81)
RDW: 14.1 % (ref 11.5–15.5)
WBC: 14.9 10*3/uL — AB (ref 4.0–10.5)

## 2017-10-21 LAB — CREATININE, SERUM
Creatinine, Ser: 1.03 mg/dL (ref 0.61–1.24)
GFR calc Af Amer: >60 mL/min (ref >60)

## 2017-10-21 LAB — POTASSIUM: Potassium: 3.4 mmol/L — ABNORMAL LOW (ref 3.5–5.1)

## 2017-10-21 NOTE — Progress Notes (Signed)
    CC: Abdominal pain  Subjective: Continues to improve.  Tolerated full liquids last night.  Had a few episodes of diarrhea, but this is better this AM.    Objective: Vital signs in last 24 hours: Temp:  [98.2 F (36.8 C)-98.6 F (37 C)] 98.2 F (36.8 C) (09/29 0556) Pulse Rate:  [72-97] 79 (09/29 0556) Resp:  [18] 18 (09/29 0556) BP: (139-197)/(86-115) 139/86 (09/29 0556) SpO2:  [98 %-100 %] 99 % (09/29 0556) Last BM Date: 10/20/17 240 p.o. Recorded 3000 IV 3050 urine No BM recorded T-max 99.5 BP is still up. WBC up to 20,000. Electrolytes are normal. CT 9/24 Intake/Output from previous day: 09/28 0701 - 09/29 0700 In: 1029.3 [P.O.:480; I.V.:448.8; IV Piggyback:100.4] Out: 1900 [Urine:1900] Intake/Output this shift: Total I/O In: -  Out: 150 [Urine:150]  General appearance: alert, cooperative and no distress Resp: clear to auscultation bilaterally GI: Soft, minimal distention - significant improvement, minimal tenderness at one spot LLQ.    Lab Results:  Recent Labs    10/20/17 0328 10/21/17 0353  WBC 16.0* 14.9*  HGB 12.9* 12.5*  HCT 38.8* 37.9*  PLT 443* 462*    BMET Recent Labs    10/19/17 0348 10/20/17 0328 10/21/17 0353  NA 138 141  --   K 3.8 3.6 3.4*  CL 107 109  --   CO2 24 25  --   GLUCOSE 127* 111*  --   BUN 5* <5*  --   CREATININE 0.99 0.96 1.03  CALCIUM 8.4* 9.1  --    PT/INR No results for input(s): LABPROT, INR in the last 72 hours.  Recent Labs  Lab 10/16/17 1826  AST 19  ALT 23  ALKPHOS 42  BILITOT 0.6  PROT 7.6  ALBUMIN 4.3     Lipase     Component Value Date/Time   LIPASE 35 10/16/2017 1826     Medications: . acetaminophen  1,000 mg Oral Q6H  . buPROPion  200 mg Oral BID  . enoxaparin (LOVENOX) injection  40 mg Subcutaneous Q24H  . gabapentin  300 mg Oral QHS  . lip balm  1 application Topical BID  . psyllium  1 packet Oral Daily  . saccharomyces boulardii  250 mg Oral BID     Assessment/Plan  Situation stress disorder - home ativan and wellbutrin HTN -hydralizine PRN  Perforated sigmoid diverticulitis -Ct with minimal change, but clinically improving.    ZOX:WRUE diet today. VTE: SCD's, lovenox AV:WUJWJ 09/24>> day 6 Foley:none Follow up:TBD     LOS: 5 days    Almond Lint 10/21/2017

## 2017-10-22 LAB — CBC
HCT: 39.1 % (ref 39.0–52.0)
Hemoglobin: 12.9 g/dL — ABNORMAL LOW (ref 13.0–17.0)
MCH: 32.7 pg (ref 26.0–34.0)
MCHC: 33 g/dL (ref 30.0–36.0)
MCV: 99 fL (ref 78.0–100.0)
Platelets: 511 10*3/uL — ABNORMAL HIGH (ref 150–400)
RBC: 3.95 MIL/uL — ABNORMAL LOW (ref 4.22–5.81)
RDW: 14.3 % (ref 11.5–15.5)
WBC: 17.1 10*3/uL — ABNORMAL HIGH (ref 4.0–10.5)

## 2017-10-22 NOTE — Progress Notes (Addendum)
[facility]/Trauma Progress Note      Assessment/Plan Situation stress disorder - home ativan and wellbutrin HTN -hydralizine PRN  Perforated sigmoid diverticulitis -Ct with minimal change, but clinically improving - no pain at rest, 1/10 pain with palpation    WJX:BJYN diet  VTE: SCD's, lovenox WG:NFAOZ 09/24>> WBC up to 17.1 from 14.9. Afebrile and clinically better Foley:none Follow up:TBD  Plan: trend WBC, am labs, soft diet. If WBC is improved tomorrow then possible discharge tomorrow.    LOS: 6 days    Subjective: CC: minimal abdominal pain  No pain at rest. No pain with BM or flatus. No nausea, vomiting, fever, chills. Overall improved.  Objective: Vital signs in last 24 hours: Temp:  [98.4 F (36.9 C)-98.9 F (37.2 C)] 98.7 F (37.1 C) (09/30 0605) Pulse Rate:  [72-107] 85 (09/30 0605) Resp:  [18-20] 18 (09/30 0605) BP: (154-172)/(83-100) 163/91 (09/30 0605) SpO2:  [95 %-100 %] 95 % (09/30 0605) Last BM Date: 10/21/17  Intake/Output from previous day: 09/29 0701 - 09/30 0700 In: 984.7 [P.O.:840; IV Piggyback:144.7] Out: 1450 [Urine:1450] Intake/Output this shift: No intake/output data recorded.  PE: Gen:  Alert, NAD, pleasant, cooperative Card:  RRR, no M/G/R heard Pulm:  CTA, no W/R/R, effort normal Abd: Soft, ND, +BS, 1/10 TTP of LLQ no guarding Skin: no rashes noted, warm and dry   Anti-infectives: Anti-infectives (From admission, onward)   Start     Dose/Rate Route Frequency Ordered Stop   10/17/17 0600  piperacillin-tazobactam (ZOSYN) IVPB 3.375 g     3.375 g 12.5 mL/hr over 240 Minutes Intravenous Every 8 hours 10/16/17 2336     10/16/17 2000  piperacillin-tazobactam (ZOSYN) IVPB 3.375 g     3.375 g 100 mL/hr over 30 Minutes Intravenous  Once 10/16/17 1957 10/16/17 2316      Lab Results:  Recent Labs    10/21/17 0353 10/22/17 0400  WBC 14.9* 17.1*  HGB 12.5* 12.9*  HCT 37.9* 39.1  PLT 462* 511*    BMET Recent Labs    10/20/17 0328 10/21/17 0353  NA 141  --   K 3.6 3.4*  CL 109  --   CO2 25  --   GLUCOSE 111*  --   BUN <5*  --   CREATININE 0.96 1.03  CALCIUM 9.1  --    PT/INR No results for input(s): LABPROT, INR in the last 72 hours. CMP     Component Value Date/Time   NA 141 10/20/2017 0328   K 3.4 (L) 10/21/2017 0353   CL 109 10/20/2017 0328   CO2 25 10/20/2017 0328   GLUCOSE 111 (H) 10/20/2017 0328   BUN <5 (L) 10/20/2017 0328   CREATININE 1.03 10/21/2017 0353   CALCIUM 9.1 10/20/2017 0328   PROT 7.6 10/16/2017 1826   ALBUMIN 4.3 10/16/2017 1826   AST 19 10/16/2017 1826   ALT 23 10/16/2017 1826   ALKPHOS 42 10/16/2017 1826   BILITOT 0.6 10/16/2017 1826   GFRNONAA >60 10/21/2017 0353   GFRAA >60 10/21/2017 0353   Lipase     Component Value Date/Time   LIPASE 35 10/16/2017 1826    Studies/Results: No results found.    Jerre Simon , [facility] 10/22/2017, 9:07 AM  Pager: [phone] Mon-Wed, Friday 7:00am-4:30pm Thurs 7am-11:30am  Consults: [phone]

## 2017-10-23 LAB — CBC
HCT: 37.3 % — ABNORMAL LOW (ref 39.0–52.0)
Hemoglobin: 12.3 g/dL — ABNORMAL LOW (ref 13.0–17.0)
MCH: 32.5 pg (ref 26.0–34.0)
MCHC: 33 g/dL (ref 30.0–36.0)
MCV: 98.4 fL (ref 78.0–100.0)
PLATELETS: 539 10*3/uL — AB (ref 150–400)
RBC: 3.79 MIL/uL — AB (ref 4.22–5.81)
RDW: 14.3 % (ref 11.5–15.5)
WBC: 16.1 10*3/uL — ABNORMAL HIGH (ref 4.0–10.5)

## 2017-10-23 NOTE — Progress Notes (Signed)
[facility]/Trauma Progress Note      Assessment/Plan Situation stress disorder - home ativan and wellbutrin HTN -hydralizine PRN  Perforated sigmoid diverticulitis -Ct with minimal change, but clinically improving - no pain at rest, 1/10 pain with palpation  ZOX:WRUE diet  VTE: SCD's, lovenox AV:WUJWJ 09/24>> WBC up to 16.1 from 17.1. Afebrile and clinically better, no TTP Foley:none Follow up:TBD  Plan: trend WBC, am labs. If WBC is improved tomorrow then possible discharge tomorrow.    LOS: 7 days    Subjective: CC: diverticulitis  No pain with BM today. Tolerating diet. No fever, chills, nausea or vomiting. No pain.  Objective: Vital signs in last 24 hours: Temp:  [98.6 F (37 C)-99.1 F (37.3 C)] 98.8 F (37.1 C) (10/01 0900) Pulse Rate:  [76-97] 97 (10/01 0900) Resp:  [18-19] 19 (10/01 0900) BP: (145-159)/(86-100) 146/86 (10/01 0900) SpO2:  [97 %-98 %] 97 % (10/01 0900) Last BM Date: 10/22/17  Intake/Output from previous day: 09/30 0701 - 10/01 0700 In: 1680 [P.O.:1680] Out: 3425 [Urine:3425] Intake/Output this shift: No intake/output data recorded.  PE: Gen:  Alert, NAD, pleasant, cooperative Card:  RRR, no M/G/R heard Pulm:  CTA, no W/R/R, effort normal Abd: Soft, ND, +BS, no TTP Skin: no rashes noted, warm and dry   Anti-infectives: Anti-infectives (From admission, onward)   Start     Dose/Rate Route Frequency Ordered Stop   10/17/17 0600  piperacillin-tazobactam (ZOSYN) IVPB 3.375 g     3.375 g 12.5 mL/hr over 240 Minutes Intravenous Every 8 hours 10/16/17 2336     10/16/17 2000  piperacillin-tazobactam (ZOSYN) IVPB 3.375 g     3.375 g 100 mL/hr over 30 Minutes Intravenous  Once 10/16/17 1957 10/16/17 2316      Lab Results:  Recent Labs    10/22/17 0400 10/23/17 0450  WBC 17.1* 16.1*  HGB 12.9* 12.3*  HCT 39.1 37.3*  PLT 511* 539*   BMET Recent Labs    10/21/17 0353  K 3.4*  CREATININE 1.03    PT/INR No results for input(s): LABPROT, INR in the last 72 hours. CMP     Component Value Date/Time   NA 141 10/20/2017 0328   K 3.4 (L) 10/21/2017 0353   CL 109 10/20/2017 0328   CO2 25 10/20/2017 0328   GLUCOSE 111 (H) 10/20/2017 0328   BUN <5 (L) 10/20/2017 0328   CREATININE 1.03 10/21/2017 0353   CALCIUM 9.1 10/20/2017 0328   PROT 7.6 10/16/2017 1826   ALBUMIN 4.3 10/16/2017 1826   AST 19 10/16/2017 1826   ALT 23 10/16/2017 1826   ALKPHOS 42 10/16/2017 1826   BILITOT 0.6 10/16/2017 1826   GFRNONAA >60 10/21/2017 0353   GFRAA >60 10/21/2017 0353   Lipase     Component Value Date/Time   LIPASE 35 10/16/2017 1826    Studies/Results: No results found.    Jerre Simon , [facility] 10/23/2017, 10:00 AM  Pager: [phone] Mon-Wed, Friday 7:00am-4:30pm Thurs 7am-11:30am  Consults: [phone]

## 2017-10-24 LAB — CBC
HEMATOCRIT: 38.6 % — AB (ref 39.0–52.0)
Hemoglobin: 12.7 g/dL — ABNORMAL LOW (ref 13.0–17.0)
MCH: 32.7 pg (ref 26.0–34.0)
MCHC: 32.9 g/dL (ref 30.0–36.0)
MCV: 99.5 fL (ref 78.0–100.0)
Platelets: 626 10*3/uL — ABNORMAL HIGH (ref 150–400)
RBC: 3.88 MIL/uL — ABNORMAL LOW (ref 4.22–5.81)
RDW: 14.2 % (ref 11.5–15.5)
WBC: 13.6 10*3/uL — ABNORMAL HIGH (ref 4.0–10.5)

## 2017-10-24 MED ORDER — AMOXICILLIN-POT CLAVULANATE 875-125 MG PO TABS: 1.0000 | ORAL_TABLET | Freq: Two times a day (BID) | ORAL | 0 refills | Status: AC

## 2017-10-24 MED ORDER — SACCHAROMYCES BOULARDII 250 MG PO CAPS: 250.0000 mg | ORAL_CAPSULE | Freq: Two times a day (BID) | ORAL | Status: AC

## 2017-10-24 NOTE — Discharge Instructions (Signed)
Diverticulitis °Diverticulitis is infection or inflammation of small pouches (diverticula) in the colon that form due to a condition called diverticulosis. Diverticula can trap stool (feces) and bacteria, causing infection and inflammation. °Diverticulitis may cause severe stomach pain and diarrhea. It may lead to tissue damage in the colon that causes bleeding. The diverticula may also burst (rupture) and cause infected stool to enter other areas of the abdomen. °Complications of diverticulitis can include: °· Bleeding. °· Severe infection. °· Severe pain. °· Rupture (perforation) of the colon. °· Blockage (obstruction) of the colon. ° °What are the causes? °This condition is caused by stool becoming trapped in the diverticula, which allows bacteria to grow in the diverticula. This leads to inflammation and infection. °What increases the risk? °You are more likely to develop this condition if: °· You have diverticulosis. The risk for diverticulosis increases if: °? You are overweight or obese. °? You use tobacco products. °? You do not get enough exercise. °· You eat a diet that does not include enough fiber. High-fiber foods include fruits, vegetables, beans, nuts, and whole grains. ° °What are the signs or symptoms? °Symptoms of this condition may include: °· Pain and tenderness in the abdomen. The pain is normally located on the left side of the abdomen, but it may occur in other areas. °· Fever and chills. °· Bloating. °· Cramping. °· Nausea. °· Vomiting. °· Changes in bowel routines. °· Blood in your stool. ° °How is this diagnosed? °This condition is diagnosed based on: °· Your medical history. °· A physical exam. °· Tests to make sure there is nothing else causing your condition. These tests may include: °? Blood tests. °? Urine tests. °? Imaging tests of the abdomen, including X-rays, ultrasounds, MRIs, or CT scans. ° °How is this treated? °Most cases of this condition are mild and can be treated at home.  Treatment may include: °· Taking over-the-counter pain medicines. °· Following a clear liquid diet. °· Taking antibiotic medicines by mouth. °· Rest. ° °More severe cases may need to be treated at a hospital. Treatment may include: °· Not eating or drinking. °· Taking prescription pain medicine. °· Receiving antibiotic medicines through an IV tube. °· Receiving fluids and nutrition through an IV tube. °· Surgery. ° °When your condition is under control, your health care provider may recommend that you have a colonoscopy. This is an exam to look at the entire large intestine. During the exam, a lubricated, bendable tube is inserted into the anus and then passed into the rectum, colon, and other parts of the large intestine. A colonoscopy can show how severe your diverticula are and whether something else may be causing your symptoms. °Follow these instructions at home: °Medicines °· Take over-the-counter and prescription medicines only as told by your health care provider. These include fiber supplements, probiotics, and stool softeners. °· If you were prescribed an antibiotic medicine, take it as told by your health care provider. Do not stop taking the antibiotic even if you start to feel better. °· Do not drive or use heavy machinery while taking prescription pain medicine. °General instructions °· Follow a full liquid diet or another diet as directed by your health care provider. After your symptoms improve, your health care provider may tell you to change your diet. He or she may recommend that you eat a diet that contains at least 25 g (25 grams) of fiber daily. Fiber makes it easier to pass stool. Healthy sources of fiber include: °? Berries. One cup   contains 4-8 grams of fiber. ? Beans or lentils. One half cup contains 5-8 grams of fiber. ? Green vegetables. One cup contains 4 grams of fiber.  Exercise for at least 30 minutes, 3 times each week. You should exercise hard enough to raise your heart rate and  break a sweat.  Keep all follow-up visits as told by your health care provider. This is important. You may need a colonoscopy. Contact a health care provider if:  Your pain does not improve.  You have a hard time drinking or eating food.  Your bowel movements do not return to normal. Get help right away if:  Your pain gets worse.  Your symptoms do not get better with treatment.  Your symptoms suddenly get worse.  You have a fever.  You vomit more than one time.  You have stools that are bloody, black, or tarry. Summary  Diverticulitis is infection or inflammation of small pouches (diverticula) in the colon that form due to a condition called diverticulosis. Diverticula can trap stool (feces) and bacteria, causing infection and inflammation.  You are at higher risk for this condition if you have diverticulosis and you eat a diet that does not include enough fiber.  Most cases of this condition are mild and can be treated at home. More severe cases may need to be treated at a hospital.  When your condition is under control, your health care provider may recommend that you have an exam called a colonoscopy. This exam can show how severe your diverticula are and whether something else may be causing your symptoms. This information is not intended to replace advice given to you by your health care provider. Make sure you discuss any questions you have with your health care provider. Document Released: 10/19/2004 Document Revised: 02/12/2016 Document Reviewed: 02/12/2016 Elsevier Interactive Patient Education  2018 [employer].  Low-Fiber Diet Follow for 4-5 weeks then resume regular diet Fiber is found in fruits, vegetables, and whole grains. A low-fiber diet restricts fibrous foods that are not digested in the small intestine. A diet containing about 10-15 grams of fiber per day is considered low fiber. Low-fiber diets may be used to:  Promote healing and rest the bowel during  intestinal flare-ups.  Prevent blockage of a partially obstructed or narrowed gastrointestinal tract.  Reduce fecal weight and volume.  Slow the movement of feces.  You may be on a low-fiber diet as a transitional diet following surgery, after an injury (trauma), or because of a short (acute) or lifelong (chronic) illness. Your health care provider will determine the length of time you need to stay on this diet. What do I need to know about a low-fiber diet? Always check the fiber content on the packaging's Nutrition Facts label, especially on foods from the grains list. Ask your dietitian if you have questions about specific foods that are related to your condition, especially if the food is not listed below. In general, a low-fiber food will have less than 2 g of fiber. What foods can I eat? Grains All breads and crackers made with white flour. Sweet rolls, doughnuts, waffles, pancakes, [place] toast, bagels. Pretzels, Melba toast, zwieback. Well-cooked cereals, such as cornmeal, farina, or cream cereals. Dry cereals that do not contain whole grains, fruit, or nuts, such as refined corn, wheat, rice, and oat cereals. Potatoes prepared any way without skins, plain pastas and noodles, refined white rice. Use white flour for baking and making sauces. Use allowed list of grains for casseroles, dumplings,  and puddings. Vegetables Strained tomato and vegetable juices. Fresh lettuce, cucumber, spinach. Well-cooked (no skin or pulp) or canned vegetables, such as asparagus, bean sprouts, beets, carrots, green beans, mushrooms, potatoes, pumpkin, spinach, yellow squash, tomato sauce/puree, turnips, yams, and zucchini. Keep servings limited to  cup. Fruits All fruit juices except prune juice. Cooked or canned fruits without skin and seeds, such as applesauce, apricots, cherries, fruit cocktail, grapefruit, grapes, mandarin oranges, melons, peaches, pears, pineapple, and plums. Fresh fruits without skin, such  as apricots, avocados, bananas, melons, pineapple, nectarines, and peaches. Keep servings limited to  cup or 1 piece. Meat and Other Protein Sources Ground or well-cooked tender beef, ham, veal, lamb, pork, or poultry. Eggs, plain cheese. Fish, oysters, shrimp, lobster, and other seafood. Liver, organ meats. Smooth nut butters. Dairy All milk products and alternative dairy substitutes, such as soy, rice, almond, and coconut, not containing added whole nuts, seeds, or added fruit. Beverages Decaf coffee, fruit, and vegetable juices or smoothies (small amounts, with no pulp or skins, and with fruits from allowed list), sports drinks, herbal tea. Condiments Ketchup, mustard, vinegar, cream sauce, cheese sauce, cocoa powder. Spices in moderation, such as allspice, basil, bay leaves, celery powder or leaves, cinnamon, cumin powder, curry powder, ginger, mace, marjoram, onion or garlic powder, oregano, paprika, parsley flakes, ground pepper, rosemary, sage, savory, tarragon, thyme, and turmeric. Sweets and Desserts Plain cakes and cookies, pie made with allowed fruit, pudding, custard, cream pie. Gelatin, fruit, ice, sherbet, frozen ice pops. Ice cream, ice milk without nuts. Plain hard candy, honey, jelly, molasses, syrup, sugar, chocolate syrup, gumdrops, marshmallows. Limit overall sugar intake. Fats and Oil Margarine, butter, cream, mayonnaise, salad oils, plain salad dressings made from allowed foods. Choose healthy fats such as olive oil, canola oil, and omega-3 fatty acids (such as found in salmon or tuna) when possible. Other Bouillon, broth, or cream soups made from allowed foods. Any strained soup. Casseroles or mixed dishes made with allowed foods. The items listed above may not be a complete list of recommended foods or beverages. Contact your dietitian for more options. What foods are not recommended? Grains All whole wheat and whole grain breads and crackers. Multigrains, rye, bran seeds,  nuts, or coconut. Cereals containing whole grains, multigrains, bran, coconut, nuts, raisins. Cooked or dry oatmeal, steel-cut oats. Coarse wheat cereals, granola. Cereals advertised as high fiber. Potato skins. Whole grain pasta, wild or brown rice. Popcorn. Coconut flour. Bran, buckwheat, corn bread, multigrains, rye, wheat germ. Vegetables Fresh, cooked or canned vegetables, such as artichokes, asparagus, beet greens, broccoli, Brussels sprouts, cabbage, celery, cauliflower, corn, eggplant, kale, legumes or beans, okra, peas, and tomatoes. Avoid large servings of any vegetables, especially raw vegetables. Fruits Fresh fruits, such as apples with or without skin, berries, cherries, figs, grapes, grapefruit, guavas, kiwis, mangoes, oranges, papayas, pears, persimmons, pineapple, and pomegranate. Prune juice and juices with pulp, stewed or dried prunes. Dried fruits, dates, raisins. Fruit seeds or skins. Avoid large servings of all fresh fruits. Meats and Other Protein Sources Tough, fibrous meats with gristle. Chunky nut butter. Cheese made with seeds, nuts, or other foods not recommended. Nuts, seeds, legumes (beans, including baked beans), dried peas, beans, lentils. Dairy Yogurt or cheese that contains nuts, seeds, or added fruit. Beverages Fruit juices with high pulp, prune juice. Caffeinated coffee and teas. Condiments Coconut, maple syrup, pickles, olives. Sweets and Desserts Desserts, cookies, or candies that contain nuts or coconut, chunky peanut butter, dried fruits. Jams, preserves with seeds, marmalade. Large amounts of  sugar and sweets. Any other dessert made with fruits from the not recommended list. Other Soups made from vegetables that are not recommended or that contain other foods not recommended. The items listed above may not be a complete list of foods and beverages to avoid. Contact your dietitian for more information. This information is not intended to replace advice given  to you by your health care provider. Make sure you discuss any questions you have with your health care provider. Document Released: 07/01/2001 Document Revised: 06/17/2015 Document Reviewed: 12/02/2012 Elsevier Interactive Patient Education  2017 [employer].

## 2017-10-24 NOTE — Progress Notes (Signed)
Discharge instructions given to pt and all questions were answered. Pt walked out to the lobby and was picked up by her girl friend.

## 2017-10-24 NOTE — Discharge Summary (Signed)
[facility] Surgery/Trauma Discharge Summary   Patient ID: Caleb Roman MRN: [id number] DOB/AGE: 1960/10/02 57 y.o.  Admit date: 10/16/2017 Discharge date: 10/24/2017  Admitting Diagnosis: Perforated sigmoid diverticulitis  Discharge Diagnosis Patient Active Problem List   Diagnosis Date Noted  . History of colonic polyps 10/20/2017  . Diverticulitis of colon with perforation 10/16/2017  . Caregiver stress 10/11/2015  . Essential hypertension 01/09/2013  . Anxiety 11/29/2010  . Depression 11/29/2010  . Hyperlipidemia 11/29/2010    Consultants none  Imaging: No results found.  Procedures None   HPI: Caleb Roman is a 57 y.o. (DOB: [birth year])  male whose primary care physician is Vito Backers, MD [facility], [place]) and comes to the [facility] today with abdominal pain. He is accompanied by his girlfriend/fiancee - [facility]  The patient has had at least 2 prior bouts of diverticular disease.  The first in March 2016 and the last one in May 2019.  Neither required hospitalization.  He has seen Dr. Leary Roca, who did a colonoscopy in 2016.  He has not seen Dr. Ewing Schlein since that time.  That colonoscopy record is not part of the Epic chart. He developed some vague abdominal pain last week, but this pain got better.  He went with his girlfriend to the movies on Sunday, ate some popcorn, and started having some worsening abdominal pain.  The abdominal pain became much worse about 4 AM this morning.  He came to the emergency room this afternoon.  He has no history of stomach, liver, or pancreatic disease.  He has had no prior abdominal surgery.  CT scan of the abdomen:  1. Acute perforated sigmoid diverticulitis with free air under the right hemidiaphragm. No abscess.             2. Trace ascites in the left paracolic gutter.             3. Aortic Atherosclerosis  WBC - 18,100 - 10/16/2017  Hospital Course:  Patient was admitted and placed on bowel rest and IV  antibiotics. As pain improved diet  was advanced as tolerated. Repeat CT showed persistent diverticulitis with small pericolonic abscess, overall stable from last CT. On 10/24/2017, the patient was voiding well, tolerating diet, ambulating well,no abdominal pain, vital signs stable, and felt stable for discharge home.  Patient will follow up as outlined below and knows to call with questions or concerns. Patient was discharged in good condition.  Physical Exam: Gen: Alert, NAD, pleasant, cooperative Card: RRR, no M/G/R heard Pulm: CTA, no W/R/R, effort normal Abd: Soft, ND, +BS,no TTP Skin: no rashes noted, warm and dry  Allergies as of 10/24/2017   No Known Allergies     Medication List    STOP taking these medications   ciprofloxacin 500 MG tablet Commonly known as:  CIPRO   doxycycline 100 MG tablet Commonly known as:  VIBRA-TABS   metroNIDAZOLE 500 MG tablet Commonly known as:  FLAGYL     TAKE these medications   acetaminophen 500 MG tablet Commonly known as:  TYLENOL Take 1,000 mg by mouth daily as needed for moderate pain.   amoxicillin-clavulanate 875-125 MG tablet Commonly known as:  AUGMENTIN Take 1 tablet by mouth every 12 (twelve) hours for 7 days.   buPROPion 200 MG 12 hr tablet Commonly known as:  WELLBUTRIN SR Take 200 mg by mouth 2 (two) times daily.   fluocinonide cream 0.05 % Commonly known as:  LIDEX Apply 1 application topically 2 (two) times  daily as needed (rash).   HYDROcodone-acetaminophen 5-325 MG tablet Commonly known as:  NORCO/VICODIN Take 1 tablet by mouth every 6 (six) hours as needed for severe pain.   ibuprofen 200 MG tablet Commonly known as:  ADVIL,MOTRIN Take 800 mg by mouth daily as needed for moderate pain.   LORazepam 1 MG tablet Commonly known as:  ATIVAN Take 1 mg by mouth every 8 (eight) hours as needed for anxiety or sleep.   MUCINEX DM PO Take 2 tablets by mouth at bedtime as needed (nasal congestion).    ondansetron 4 MG tablet Commonly known as:  ZOFRAN Take 1 tablet (4 mg total) by mouth every 6 (six) hours.   saccharomyces boulardii 250 MG capsule Commonly known as:  FLORASTOR Take 1 capsule (250 mg total) by mouth 2 (two) times daily.        Follow-up Information    Karie Soda, MD. Schedule an appointment as soon as possible for a visit in 3 week(s).   Specialty:  General Surgery Why:  to discuss elective surgery to remove affected part of colon. You will need a colonoscopy in 6-8 weeks with your Gastroenterologist Contact information: [address] [place] [address] [phone]           Signed: Joyce Copa [facility] Surgery 10/24/2017, 10:22 AM Pager: [phone] Consults: [phone] Mon-Fri 7:00 am-4:30 pm Sat-Sun 7:00 am-11:30 am

## 2019-01-02 IMAGING — CT CT ABD-PELV W/ CM
2 of 5 series · 16 of 46 positions shown, 18 images · IV contrast (OMNIPAQUE)
Comparison: CT, 10/16/2017.

CLINICAL DATA: Low grade fever. History of perforated
diverticulitis.

EXAM:
CT ABDOMEN AND PELVIS WITH CONTRAST
TECHNIQUE: Multidetector CT imaging of the abdomen and pelvis was performed
using the standard protocol following bolus administration of
intravenous contrast.
CONTRAST:  100mL OMNIPAQUE IOHEXOL 300 MG/ML  SOLN

[Series 2: axial st · axial · 0.82mm/px · z∈[-681,-301]mm · 13 of 88 slices shown, 15 images]
[im 6/88  soft-tissue]
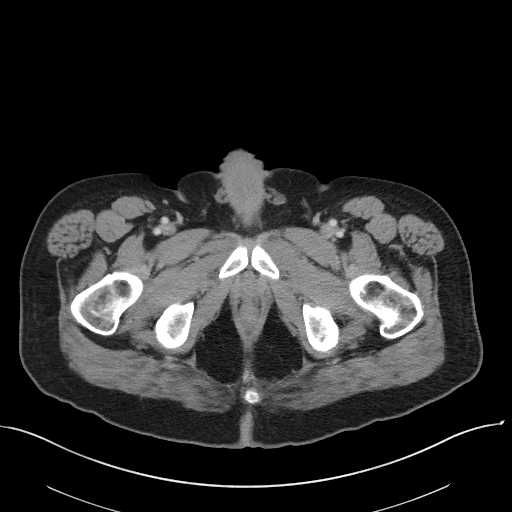
[im 6/88  bone]
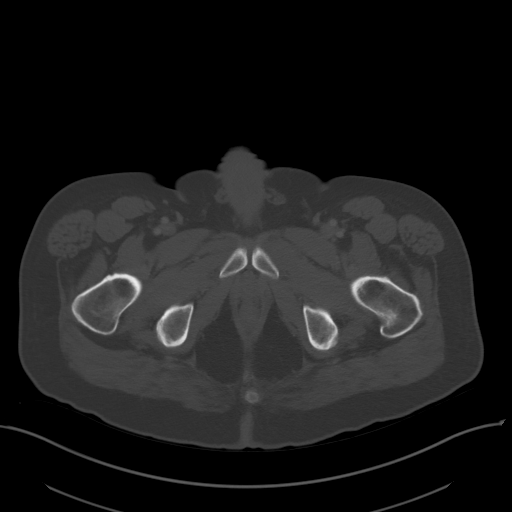
[im 12/88  soft-tissue]
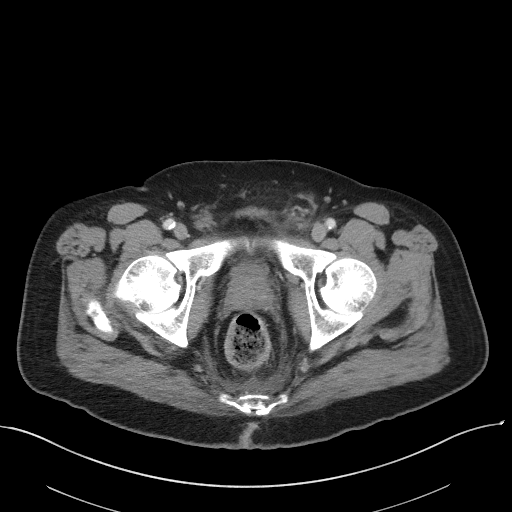
[im 18/88  soft-tissue]
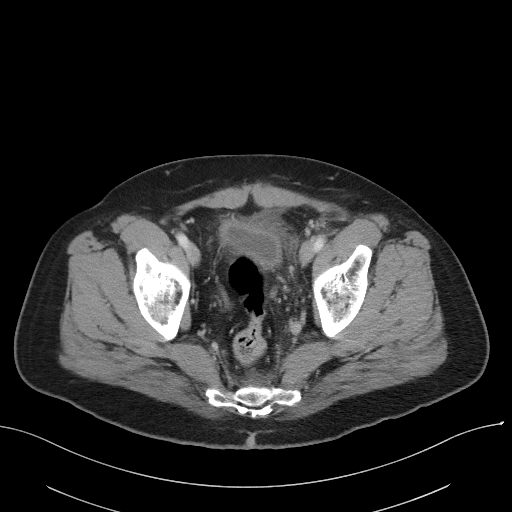
[im 24/88  soft-tissue]
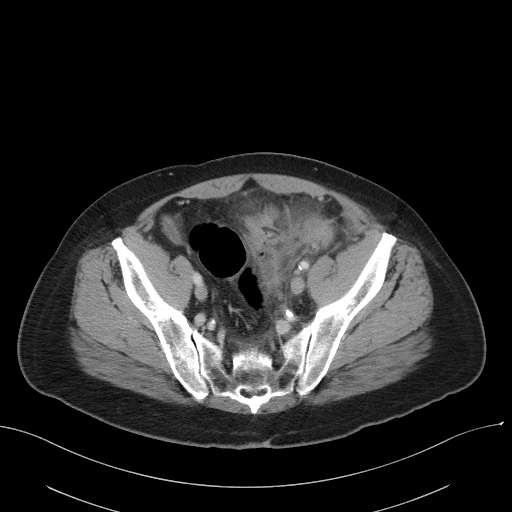
[im 30/88  soft-tissue]
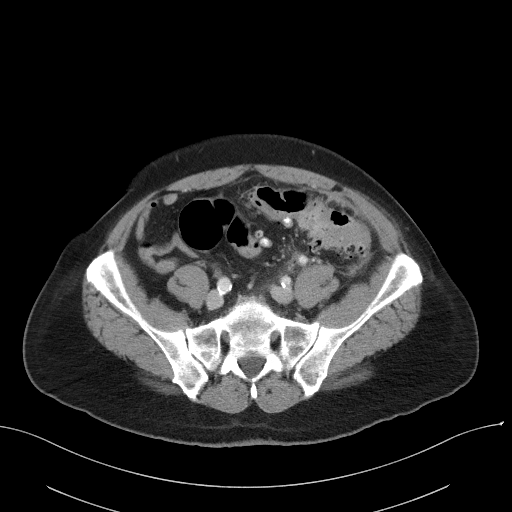
[im 35/88  soft-tissue]
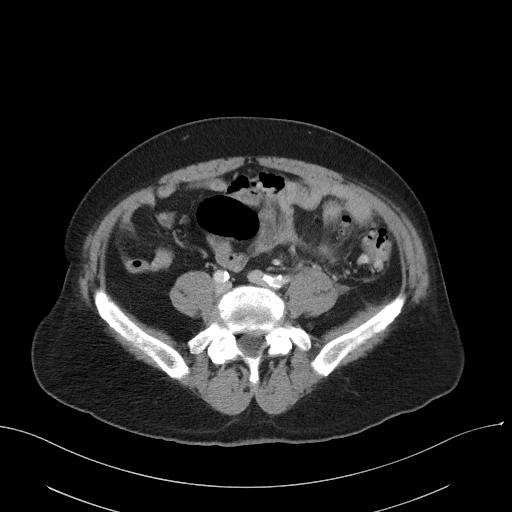
[im 47/88  soft-tissue]
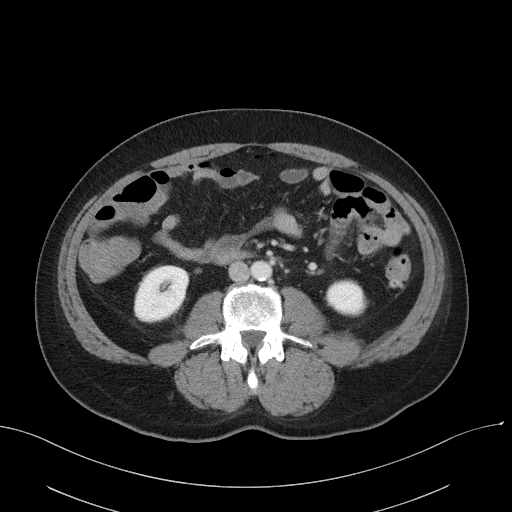
[im 53/88  soft-tissue]
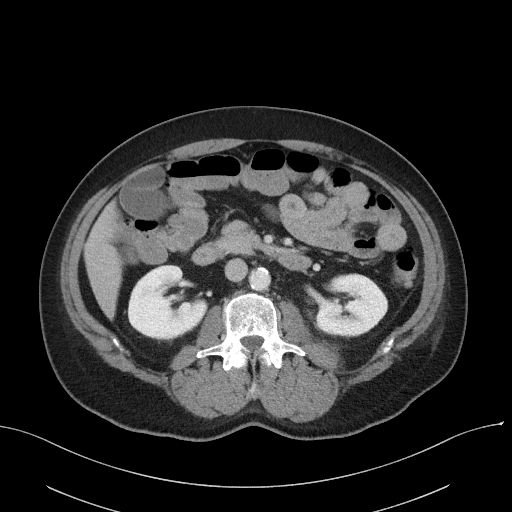
[im 59/88  soft-tissue]
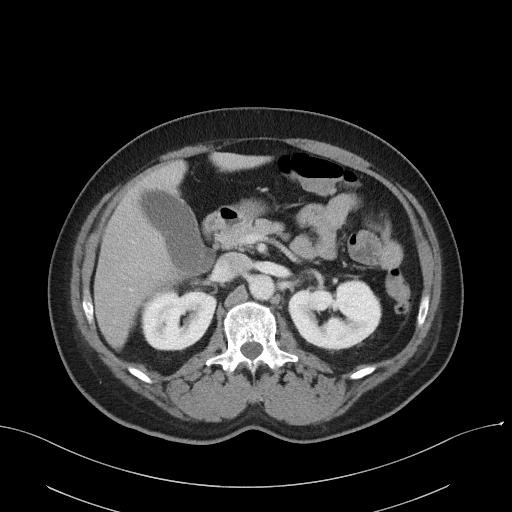
[im 59/88  bone]
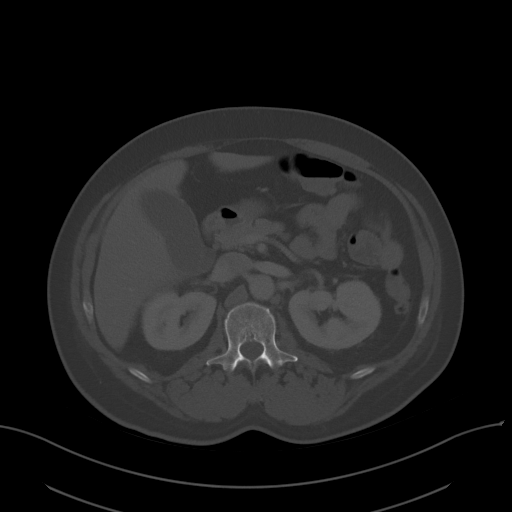
[im 64/88  soft-tissue]
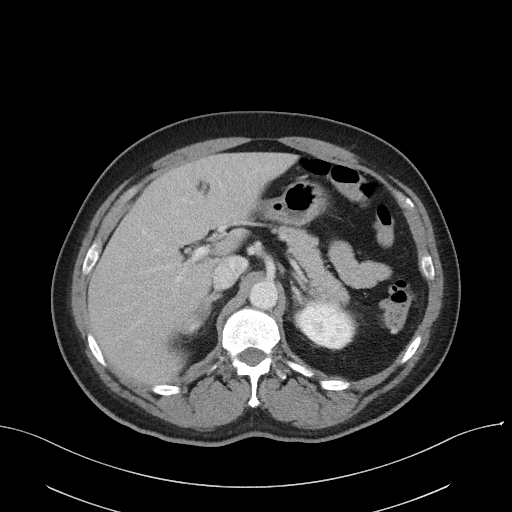
[im 70/88  soft-tissue]
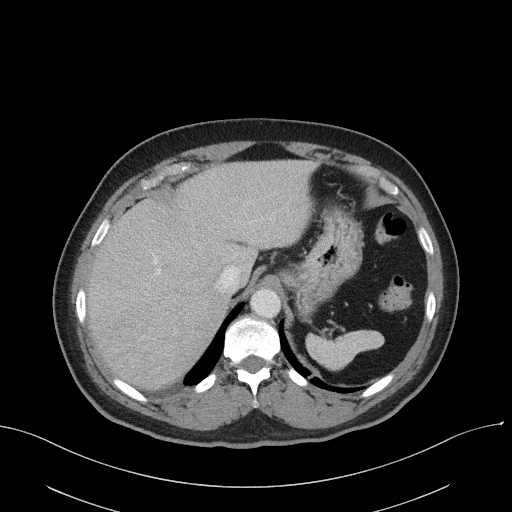
[im 76/88  soft-tissue]
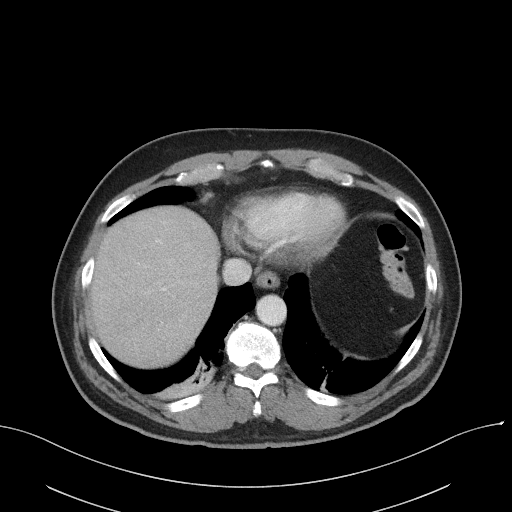
[im 82/88  soft-tissue]
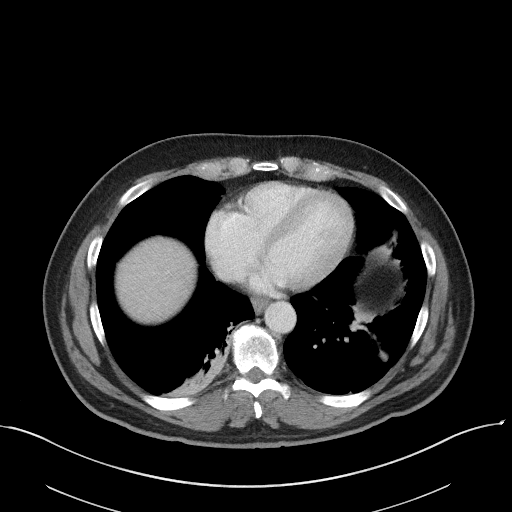

[Series 4: coronal st · coronal · 0.89mm/px · 3 of 75 slices shown]
[im 25/75  soft-tissue]
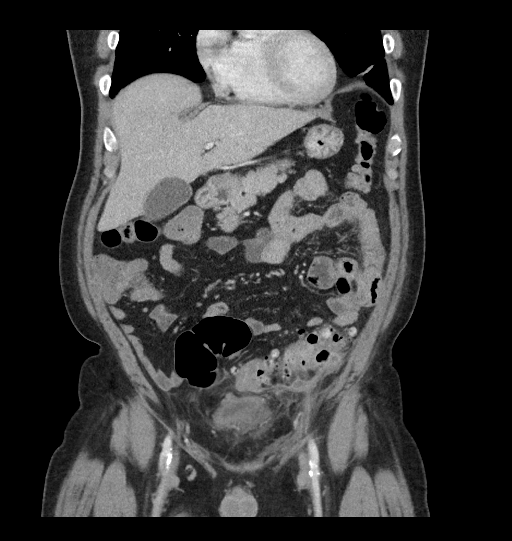
[im 33/75  soft-tissue]
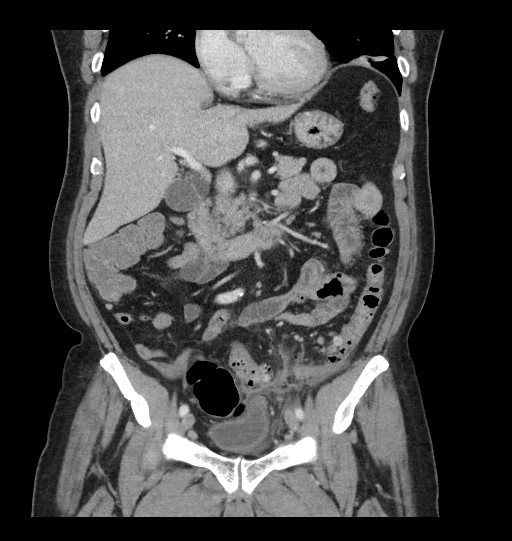
[im 42/75  soft-tissue]
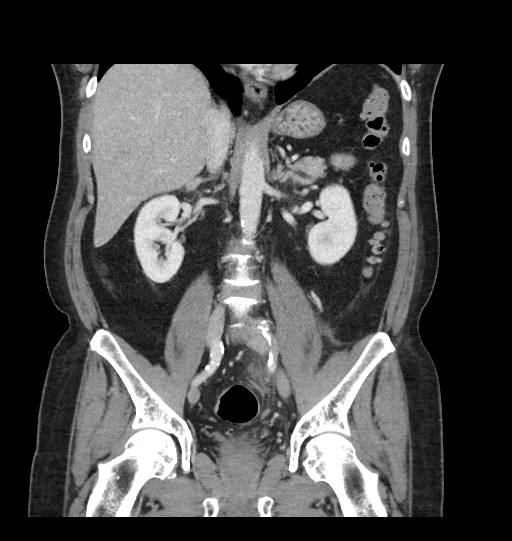

[16 of 46 positions shown; findings below may reference images not displayed]

FINDINGS: Lower chest: Lung base atelectasis, mildly increased from the prior
CT. Trace right pleural effusion. No convincing pneumonia. Heart
normal size.

Hepatobiliary: No focal liver abnormality is seen. No gallstones,
gallbladder wall thickening, or biliary dilatation.

Pancreas: Unremarkable. No pancreatic ductal dilatation or
surrounding inflammatory changes.

Spleen: Relatively small spleen. No splenic mass or focal lesion.
Stable from the prior study.

Adrenals/Urinary Tract: Adrenal glands are unremarkable. Kidneys are
normal, without renal calculi, focal lesion, or hydronephrosis.
Bladder is unremarkable.

Stomach/Bowel: Changes of sigmoid diverticulitis described
previously are similar. There is wall thickening of the proximal
sigmoid colon adjacent inflammation. There is a small fluid
collection, now better defined, containing bubbles of air, along the
anterior inferior margin of the proximal sigmoid colon, measuring
2.9 x 1.5 x 1.5 cm. No free air. There are numerous colonic
diverticula. No other areas of diverticulitis. No evidence of bowel
obstruction. Small bowel is normal in caliber with no wall
thickening or inflammation. Stomach is unremarkable. Normal appendix
is visualized.

Vascular/Lymphatic: Aortic atherosclerosis. No enlarged abdominal or
pelvic lymph nodes.

Reproductive: Unremarkable.

Other: Trace pelvic free fluid associated with the sigmoid colon
inflammation, similar to the prior exam.

Musculoskeletal: No acute or significant osseous findings.
IMPRESSION: 1. Persistent sigmoid colon diverticulitis. Small pericolonic
abscess has developed along the anterior inferior margin of the
proximal sigmoid colon measuring 2.9 cm in greatest dimension. The
overall degree of inflammation is stable from the recent prior CT.
There are no new areas of colonic inflammation. No free air.
2. Mild increase in lung base atelectasis. Trace right pleural
effusion new from the prior study.
3. No other change.
